# Patient Record
Sex: Female | Born: 1965 | Race: White | Hispanic: No | Smoking: Never smoker
Health system: Southern US, Community
[De-identification: ages and names within clinical notes are randomized; demographics above are authoritative.]

## PROBLEM LIST (undated history)

## (undated) DIAGNOSIS — F329 Major depressive disorder, single episode, unspecified: Secondary | ICD-10-CM

## (undated) DIAGNOSIS — F32A Depression, unspecified: Secondary | ICD-10-CM

## (undated) DIAGNOSIS — F419 Anxiety disorder, unspecified: Secondary | ICD-10-CM

## (undated) DIAGNOSIS — M542 Cervicalgia: Secondary | ICD-10-CM

## (undated) DIAGNOSIS — G4700 Insomnia, unspecified: Secondary | ICD-10-CM

## (undated) HISTORY — DX: Anxiety disorder, unspecified: F41.9

## (undated) HISTORY — DX: Insomnia, unspecified: G47.00

## (undated) HISTORY — DX: Cervicalgia: M54.2

## (undated) HISTORY — DX: Depression, unspecified: F32.A

## (undated) HISTORY — PX: TONSILLECTOMY: SUR1361

## (undated) HISTORY — PX: TUBAL LIGATION: SHX77

## (undated) HISTORY — DX: Major depressive disorder, single episode, unspecified: F32.9

## (undated) HISTORY — PX: OTHER SURGICAL HISTORY: SHX169

---

## 2012-05-04 LAB — HM PAP SMEAR

## 2012-06-21 LAB — HM MAMMOGRAPHY

## 2012-06-30 ENCOUNTER — Ambulatory Visit: Payer: Self-pay

## 2012-12-20 ENCOUNTER — Ambulatory Visit: Payer: Self-pay | Admitting: Family Medicine

## 2013-05-30 ENCOUNTER — Ambulatory Visit: Payer: Self-pay | Admitting: Family Medicine

## 2013-05-31 ENCOUNTER — Ambulatory Visit: Payer: Self-pay | Admitting: Family Medicine

## 2013-07-21 ENCOUNTER — Ambulatory Visit: Payer: Self-pay | Admitting: Family Medicine

## 2014-06-06 ENCOUNTER — Observation Stay: Payer: Self-pay | Admitting: Internal Medicine

## 2014-06-24 ENCOUNTER — Ambulatory Visit
Admit: 2014-06-24 | Disposition: A | Discharge status: Home / Self Care | Payer: Self-pay | Attending: Internal Medicine | Admitting: Internal Medicine

## 2014-06-24 ENCOUNTER — Observation Stay
Admit: 2014-06-24 | Disposition: A | Discharge status: Home / Self Care | Payer: Self-pay | Attending: Surgery | Admitting: Surgery

## 2014-06-24 LAB — COMPREHENSIVE METABOLIC PANEL
ALBUMIN: 4.4 g/dL
ANION GAP: 8 (ref 7–16)
Alkaline Phosphatase: 44 U/L
BUN: 9 mg/dL
Bilirubin,Total: 0.8 mg/dL
Calcium, Total: 9.1 mg/dL
Chloride: 103 mmol/L
Co2: 24 mmol/L
Creatinine: 0.63 mg/dL
EGFR (African American): >60
EGFR (Non-African Amer.): >60
Glucose: 106 mg/dL — ABNORMAL HIGH
POTASSIUM: 3.5 mmol/L
SGOT(AST): 17 U/L
SGPT (ALT): 17 U/L
SODIUM: 135 mmol/L
Total Protein: 7.7 g/dL

## 2014-06-24 LAB — CBC WITH DIFFERENTIAL/PLATELET
BASOS PCT: 0.5 %
Basophil #: 0.1 10*3/uL (ref 0.0–0.1)
EOS ABS: 0.6 10*3/uL (ref 0.0–0.7)
Eosinophil %: 2.8 %
HCT: 41 % (ref 35.0–47.0)
HGB: 13.4 g/dL (ref 12.0–16.0)
LYMPHS PCT: 9.6 %
Lymphocyte #: 2 10*3/uL (ref 1.0–3.6)
MCH: 28.7 pg (ref 26.0–34.0)
MCHC: 32.8 g/dL (ref 32.0–36.0)
MCV: 87 fL (ref 80–100)
MONO ABS: 1.7 x10 3/mm — AB (ref 0.2–0.9)
Monocyte %: 8.2 %
Neutrophil #: 16.1 10*3/uL — ABNORMAL HIGH (ref 1.4–6.5)
Neutrophil %: 78.9 %
PLATELETS: 301 10*3/uL (ref 150–440)
RBC: 4.69 10*6/uL (ref 3.80–5.20)
RDW: 13.7 % (ref 11.5–14.5)
WBC: 20.4 10*3/uL — AB (ref 3.6–11.0)

## 2014-06-25 LAB — CBC WITH DIFFERENTIAL/PLATELET
Basophil #: 0.1 10*3/uL (ref 0.0–0.1)
Basophil %: 0.4 %
EOS PCT: 3.9 %
Eosinophil #: 0.5 10*3/uL (ref 0.0–0.7)
HCT: 35.9 % (ref 35.0–47.0)
HGB: 11.7 g/dL — ABNORMAL LOW (ref 12.0–16.0)
Lymphocyte #: 2 10*3/uL (ref 1.0–3.6)
Lymphocyte %: 14.6 %
MCH: 28.8 pg (ref 26.0–34.0)
MCHC: 32.5 g/dL (ref 32.0–36.0)
MCV: 89 fL (ref 80–100)
MONO ABS: 1.2 x10 3/mm — AB (ref 0.2–0.9)
MONOS PCT: 9 %
NEUTROS ABS: 10 10*3/uL — AB (ref 1.4–6.5)
Neutrophil %: 72.1 %
Platelet: 260 10*3/uL (ref 150–440)
RBC: 4.06 10*6/uL (ref 3.80–5.20)
RDW: 13.7 % (ref 11.5–14.5)
WBC: 13.9 10*3/uL — ABNORMAL HIGH (ref 3.6–11.0)

## 2014-07-17 LAB — SURGICAL PATHOLOGY

## 2014-07-23 NOTE — Discharge Summary (Signed)
PATIENT NAME:  Teresa Gardner, Teresa Gardner MR#:  [id number] DATE OF BIRTH:  09/25/1965  DATE OF ADMISSION:  06/06/2014 DATE OF DISCHARGE:  06/10/2014  PRESENTING COMPLAINT: Abdominal pain with bright red blood per rectum.   DISCHARGE DIAGNOSIS:  Descending colitis.   PROCEDURES: Flexible sigmoidoscopy shows diffuse mild inflammation in the descending colon secondary to colitis, biopsied. Erythematous mucosa in the rectum.   CODE STATUS: Full code.   MEDICATIONS:  1.  Flagyl 500 p.o. t.i.d.  2.  Cipro 500 b.i.d.  3.  Ambien 10 mg 1/2 tablet at bedtime.  4.  Zofran ODT 4 mg 1 tablet every 3 times a day as needed.  5.  Percocet 5/225 one tablet 1-3 times as needed.   DIET: Mechanical soft.   FOLLOWUP:   1.  With Dr. Bluford Kaufmannh in 1 to 2 weeks.  2.  Follow up with your primary care physician Dr. Dossie Arbourrissman in 1-2 weeks.  CONSULTATIONS:  GI with Dr. Bluford Kaufmannh.  BRIEF SUMMARY OF HOSPITAL COURSE: Teresa GraffJanet Kruemmel is a 49 year old Caucasian female with no significant past medical history who came in with abdominal pain and bloody diarrhea. She was admitted with:  1.  Colitis descending colon per CT scan.  She was admitted with abdominal, bloody diarrhea suspected due to colitis in her descending colon. The patient underwent flexible sigmoidoscopy.  Results as above were noted. She was followed by Dr. Bluford Kaufmannh and will complete a course of Cipro and Flagyl as an outpatient for about 10 days and follow up biopsy results with Dr. Bluford Kaufmannoh as an outpatient. She was able to tolerate regular food prior to discharge. 2.  Hypokalemia, improved with supplementation.  3.  Headache, likely stress-related. CT head was negative.   Hospital stay otherwise remained stable.  The patient remained a full code.   TIME SPENT: 40 minutes.   ____________________________ Wylie HailSona A. Allena KatzPatel, MD sap:sp D: 06/11/2014 07:40:00 ET T: 06/11/2014 09:29:55 ET JOB#: [id number]  cc: Alaylah Heatherington A. Allena KatzPatel, MD, <Dictator> Ezzard StandingPaul Y. Bluford Kaufmannh, MD Steele SizerMark A. Crissman, MD Willow OraSONA A Gerard Bonus  MD ELECTRONICALLY SIGNED 06/26/2014 12:17

## 2014-07-23 NOTE — Consult Note (Signed)
Pt seen and examined. Please see C.London's notes. Overall improved. Mild low abdominal tenderness. CT shows colitis. Likely infectious but ischemic also possible. Agree with Abx. Diarrhea and rectal bleeding has stopped. If continues to improve, hold off on flex sig/colonoscopy. If symptoms worsen, then will plan soon. thanks.  Electronic Signatures: Lutricia Feilh, Delane Stalling (MD)  (Signed on 16-Mar-16 17:13)  Authored  Last Updated: 16-Mar-16 17:13 by Lutricia Feilh, Lilyian Quayle (MD)

## 2014-07-23 NOTE — Consult Note (Signed)
PATIENT NAME:  Teresa Gardner, Teresa Gardner MR#:  [id number] DATE OF BIRTH:  12-Nov-1965  DATE OF CONSULTATION:  06/07/2014  REFERRING PHYSICIAN:   CONSULTING PHYSICIAN:  Teresa Barre, NP  REASON FOR CONSULTATION: GI consult ordered by Dr. Clent Ridges for evaluation of GI bleed.   HISTORY OF PRESENT ILLNESS:  Appreciate consult for 49 year old Caucasian woman admitted with abdominal pain/bloody diarrhea for evaluation of same. She states she is generally healthy and has no chronic issues other than arthritis in her neck for which sometimes she takes ibuprofen or has massage/heat therapy. Reports feeling well until early Sunday morning. States she had sushi Friday and steak on Saturday, developed lower abdominal pain and diarrhea early Sunday morning at approximately 2:00 a.m., passed several stools which later became bloody. States the most blood she passed was about 2 tbsp at a time, at some point developed a fever, temperature max 102.4. Presented to the ED yesterday and was subsequently admitted, started on Cipro and Flagyl.  Says no further bloody diarrhea, last was yesterday. Passing small amounts of flatus. Says she continues lower abdominal pain that travels to her back and down her legs. States the abdominal pain may be a little better, but not the back pain.  Her menses started yesterday. No fever today. Blood cultures negative so far. No significant leukocytosis. Hemoglobin stable with normocytic cells. No history of luminal evaluation. Had abdominopelvic CT that demonstrates some mild wall thickening to the descending colon, otherwise unremarkable.   PAST MEDICAL HISTORY:  Osteoarthritis.   HOME MEDICATIONS: Ambien 10 mg p.o. at bedtime p.r.n., ibuprofen p.r.n.   SOCIAL HISTORY: Lives with her 3 children. No tobacco or illicits. Endorses 1 alcoholic beverage every other day.   FAMILY HISTORY: Several members with skin cancer. Great aunt with liver cancer. Maternal grandmother with lymphoma. No known  colorectal cancer, colon polyps, or ulcers.   REVIEW OF SYSTEMS:  Ten systems reviewed, significant for fever, fatigue, weakness, chronic back pain. Otherwise unremarkable.   LABORATORY DATA: Most recent laboratories, glucose 105, BUN less than 5, creatinine 0.7, sodium 137, potassium 3.1, GFR greater than 60, calcium 7.7, lipase normal. Liver panel normal. WBC normal, hemoglobin 11.4, hematocrit 34.9, platelet count 161,000, red cells normocytic. PT 12.8, INR 0.9. Blood cultures negative x 2 so far. CT demonstrating some thickened descending colon wall, otherwise not very remarkable.   PHYSICAL EXAMINATION:  VITAL SIGNS: Most recent vital signs, temperature 98.4, pulse 76, respiratory rate 20, blood pressure 98/62, SaO2 97% on room air.  GENERAL: Well-appearing woman in no acute distress.  HEENT: Normocephalic, atraumatic. Conjunctivae pink. Sclerae clear.  NECK: Supple. No JVD.  CHEST: Respirations eupneic. Lungs clear.  CARDIAC: S1, S2, RRR. No MRG. No edema.  ABDOMEN: Flat. Bowel sounds x 4. Nondistended, soft. Tenderness to the bilateral lower abdominal quadrants. No rebound tenderness, rigidity, guarding, or peritoneal signs, hepatosplenomegaly, or masses.  SKIN: Warm, dry, pink. No erythema, lesion or rash.  EXTREMITIES: MAEW x 4. Strength 5 out of 5. Gait steady.  NEUROLOGICAL: Alert, oriented x 3. Cranial nerves II through XII intact. Speech clear. No facial droop.  PSYCHIATRIC: Pleasant, calm, cooperative.   IMPRESSION AND PLAN: Lower abdominal pain, history of bloody diarrhea, suspect infectious agent, however differential diagnosis includes ischemia and inflammatory bowel disease. Agree with Cipro and Flagyl. Do not think stool studies would be informative at this point as the patient is on antibiotics, no further diarrhea. With her current complaints of pain will get 2 view abdomen today. Likely may need a flexible  sigmoidoscopy depending on her clinical course. Thank you very much  for this consult.   These services were provided by Teresa Pathristiane Marieclaire Bettenhausen, Teresa Gardner, Teresa Gardner, in collaboration with Teresa FeilPaul Oh, MD, with whom I discussed this patient in full.     ____________________________ Teresa Barrehristiane H. Alberto Pina, NP chl:bu D: 06/07/2014 17:11:12 ET T: 06/07/2014 17:36:50 ET JOB#: [id number]  cc: Teresa Barrehristiane H. Marthe Dant, NP, <Dictator> Teresa Gardner ELECTRONICALLY SIGNED 06/08/2014 8:37

## 2014-07-23 NOTE — H&P (Signed)
   Subjective/Chief Complaint 49 yo F with perianal swelling, pain x 3 days   History of Present Illness 49 yo F who presents with 3 days of perianal pain.  Began acutely following episode of colitis.  + low grade fevers.  Presented to urgent care where they attempted to drain it but were unsuccessful.  Also with some back pain and pain in pubis.  No chills, chest pain, shortness of breath, cough, abdominal pain, diarrhea/constipation, nausea/vomiting, dysuria/hematuria.   Past History H/o colitis H/o rhinoplasty H/o tubal ligation   Code Status Full Code   Past Med/Surgical Hx:  GI Bleed:   Rhinoplasty:   Tubal ligation:   ALLERGIES:  No Known Allergies:   Family and Social History:  Family History Cancer   Social History negative tobacco, positive ETOH, Social EtOH   Place of Living Home   Review of Systems:  Subjective/Chief Complaint Perianal pain, swelling   Fever/Chills Yes   Cough No   Sputum No   Abdominal Pain No   Diarrhea No   Constipation No   Nausea/Vomiting No   SOB/DOE No   Tolerating Diet Yes  Nauseated  Vomiting   Physical Exam:  GEN well developed, well nourished, no acute distress   HEENT pink conjunctivae, PERRL, hearing intact to voice, good dentition   RESP normal resp effort  clear BS  no use of accessory muscles   CARD regular rate  no murmur  no thrills   ABD denies tenderness  denies Flank Tenderness  no hernia  soft  normal BS  Anus with perianal pain, swelling, min drainage   EXTR negative cyanosis/clubbing, negative edema   SKIN normal to palpation, No rashes, No ulcers, skin turgor good   NEURO cranial nerves intact, negative rigidity, negative tremor, follows commands, strength:, motor/sensory function intact   PSYCH A+O to time, place, person, good insight    Assessment/Admission Diagnosis 49 yo F with recent colitis, perianal abscess.  H/o recent colitis.  + systemic symptoms which seem to be out of proportion which  what I observe on physical examination.   Plan Will obtain CT to eval for possible intraabdominal process which may be draining through anus.  Likely will admit, NPO, IV abx, IV fluids, probable I and D of perianal abscess in OR.   Electronic Signatures: Jarvis NewcomerLundquist, Taite Baldassari A (MD)  (Signed 02-Apr-16 21:26)  Authored: CHIEF COMPLAINT and HISTORY, PAST MEDICAL/SURGIAL HISTORY, ALLERGIES, FAMILY AND SOCIAL HISTORY, REVIEW OF SYSTEMS, PHYSICAL EXAM, ASSESSMENT AND PLAN   Last Updated: 02-Apr-16 21:26 by Jarvis NewcomerLundquist, Murrel Freet A (MD)

## 2014-07-23 NOTE — Consult Note (Signed)
Brief Consult Note: Diagnosis: abdominal pain, bloody diarrhea.   Patient was seen by consultant.   Consult note dictated.   Comments: Appreciate cx for 48y/o caucasian woman admitted with abdominal pain/bloody diarrhea for evaluation of same. States she is generally healthy and has no chronic issues other than arthritis in her neck, for which sometimes she takes Ibuprofen, or has massage/heat therapy. Reports feeling well until early Sun am. Had sushi Friday and Steak Sat. Developed lower abdominal pain and diarrhea early Sun am, about 2.  Passed several stools, which became bloody. States most blood she passed was about 2tbsp at a time. At some point developed a fever, tmax 102.4. Presented to ED yesterday and was subsequently admitted. Started on Cipro/Flagyl  Says no further bloody diarrhea- last was yesterday. Passing small amts flatus. Says she continues with lower abdominal pain that travels down her legs and lower back pain. States the abd pain may be a little better, but not her back pain. Menses started yesterday. No fever today. Blood cultures negative so far. No significant leulocytosis. Hgb stable with normocytic cells. No history of luminal evaluation. Ct with some mild wall thickening to the descending colon, otherwise unremarkable. Impression and plan: lower abdominal pain, history of bloody diarrhea, suspect infectious, however ddx includes ibd./ischemia. Agree with Cipro and flagyl. Do not think stools studies would be informative at this point as pt on abx and no further diarrhea. With pain will get 2 view abd today. Likely will need at least flex sig for luminal evaluation. Recommend electrolyte correction. Will discuss further with Dr Bluford Kaufmannh..  Electronic Signatures: Vevelyn PatLondon, Christiane H (NP)  (Signed 16-Mar-16 17:19)  Authored: Brief Consult Note   Last Updated: 16-Mar-16 17:19 by Keturah BarreLondon, Christiane H (NP)

## 2014-07-23 NOTE — Consult Note (Signed)
Mild colitis involving descending colon. Moderate colitis involving rectum. Rest of colon looks normal. Multiple biopies taken. Continue Abx. Await bx. Full liquid diet ordered. Contact GI on call if questions. thanks.  Electronic Signatures: Lutricia Feilh, Saifullah Jolley (MD)  (Signed on 18-Mar-16 12:37)  Authored  Last Updated: 18-Mar-16 12:37 by Lutricia Feilh, Laurice Iglesia (MD)

## 2014-07-23 NOTE — Op Note (Signed)
PATIENT NAME:  Teresa Gardner, Teresa Gardner MR#:  [id number] DATE OF BIRTH:  11-28-1965  DATE OF PROCEDURE:  06/25/2014  OPERATION PERFORMED:  1. Anorectal examination under anesthesia.  2. Drainage of perianal abscess.   PREOPERATIVE DIAGNOSIS: Perianal abscess.   POSTOPERATIVE DIAGNOSIS: Perianal abscess.  SURGEON: Claude MangesWilliam F. Samona Chihuahua, MD.   ANESTHESIA: General.   OPERATION IN DETAIL: The patient was placed in the lithotomy position and prepped and draped in the usual fashion. Digital rectal examination failed to reveal any induration within the anal canal or any obvious pathology. The abscess had already spontaneously drained and there was some exudative material within it. It was slightly to the right of the posterior midline such that, with the patient in the lithotomy position, it was at approximately 7 o'clock. The external opening was just a centimeter or two from the external anal canal, and it was 2 cm long and just under a centimeter wide. I debrided the fibrinous exudative material, and there was some granulation tissue at the base. I then used an anoscope, and neither in the posterior midline nor in the right posterior area was there any obvious visual pathology and with a lacrimal duct probe bent such that it hooked toward the outside could not demonstrate any kind of internal fistulous opening. I then forcibly irrigated the external opening with hydrogen peroxide and there was no bubbling within the anal canal. Therefore, I packed the abscess cavity with quarter-inch plain gauze and placed mesh panties on top of it to keep it in place. That concluded the procedure. There were no complications.   ____________________________ Claude MangesWilliam F. Victormanuel Mclure, MD wfm:jh D: 06/25/2014 15:15:20 ET T: 06/26/2014 08:10:42 ET JOB#: [id number]  cc: Claude MangesWilliam F. Lakevia Perris, MD, <Dictator> Claude MangesWILLIAM F Delanee Xin MD ELECTRONICALLY SIGNED 07/03/2014 21:44

## 2014-07-23 NOTE — Consult Note (Signed)
Pt with bouts of vomiting since last night. No bowel movements. No rectal bleeding. Overall abdominal pain has improved. Will plan flex sigmoidoscopy tomorrow AM. Will give tap water enemas tonight until clear. NPO after MN. No bowel prep orally due to vomiting. thanks   Electronic Signatures: Lutricia Feilh, Ashlin Kreps (MD) (Signed on 17-Mar-16 16:14)  Authored   Last Updated: 18-Mar-16 07:54 by Lutricia Feilh, Andrue Dini (MD)

## 2014-07-23 NOTE — H&P (Signed)
PATIENT NAME:  Teresa Gardner, Teresa Gardner MR#:  [id number] DATE OF BIRTH:  October 24, 1965  DATE OF ADMISSION:  06/06/2014  PRIMARY CARE PHYSICIAN: [facility]   REFERRING EMERGENCY ROOM PHYSICIAN: Darien Ramusavid W. Kaminski, MD   CHIEF COMPLAINT: Abdominal pain with bright red blood per rectum.   HISTORY OF PRESENT ILLNESS: This very pleasant 49 year old woman with no significant past medical history presents today after 3 days of bloody bowel movements with progressively worsening abdominal pain. She reports that she awoke on Sunday morning at 2:00 a.m. with abdominal cramping and diarrhea. She went to the bathroom many times and at about 10:00 a.m. she noted the toilet was full of bright red blood. Throughout the day on Sunday, every time she had a bowel movement, it consisted mainly of blood. At one point, she felt urgency and almost lost continence, but other than that has had good control of bowel movements. In the morning on Monday she felt better and actually went to work. During her work day she had a few bowel movements with clotted blood. She went to urgent care, where her rectal examination was positive for frank blood. She was referred to a gastroenterologist at that time. Last night at home she had progressive increase in the pain in her abdomen, back and a headache. She also developed a fever this morning up to 102.4 per the patient report. She then presented to the Emergency Room for further evaluation.   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   HOME MEDICATIONS: Ibuprofen several times a week, not daily.  SOCIAL HISTORY:  She lives with her 3 children.  She does not smoke cigarettes.  She drinks alcohol about 1 alcoholic beverage every other day with more on weekends, up to 4-5 drinks over the weekend. She works for the [employer].   FAMILY MEDICAL HISTORY: Positive for skin cancer. Negative for coronary artery disease, stroke, or diabetes. She does report 1 aunt with  pancreatic and hepatic cancer.   REVIEW OF SYSTEMS: CONSTITUTIONAL: Positive for fever, fatigue, and weakness with abdominal pain. No change in weight.  HEENT: No change in hearing or vision. No pain in the eyes or ears. No sinus congestion, difficulty swallowing, or sore throat.  RESPIRATORY: No shortness of breath, wheezing, hemoptysis, or cough.  CARDIOVASCULAR: No chest pain, edema, arrhythmia, or syncope.  GASTROINTESTINAL: Positive as above for nausea. No vomiting. Diarrhea, abdominal pain, and hematochezia. No hematemesis.  GENITOURINARY: No dysuria or frequency.  ENDOCRINE: No polyuria, polydipsia, or hot or cold intolerance.  SKIN: No new rashes or lesions.  MUSCULOSKELETAL: She has chronic neck pain, but recently has had increasing pain in the back. No pain in his shoulders, knees, or hips. No gout.  NEUROLOGIC: No focal numbness or weakness. No confusion, seizure. Positive for headache.  PSYCHIATRIC: No bipolar disorder or schizophrenia.   PHYSICAL EXAMINATION: VITAL SIGNS: Temperature 98.2, pulse 96, respirations 18, blood pressure 101/68,  oxygenation 98% on room air.  GENERAL: No acute distress.  HEENT: Pupils equal, round, and reactive to light. Extraocular motion intact. Conjunctivae are clear. Oral mucous membranes are dry. Good dentition. Posterior oropharynx is clear with no exudate, edema, or erythema. Trachea is midline.  NECK: Supple. No cervical lymphadenopathy. Thyroid nontender.  RESPIRATORY: Lungs are clear to auscultation bilaterally with good air movement.  CARDIOVASCULAR: Regular rate and rhythm. No murmurs, rubs, or gallops. Peripheral pulses are 2+. No pitting edema.  ABDOMEN: Soft. She has just received a dose of Dilaudid so she is able to  indicate that her pain used to be in the right lower quadrant, but she does not have any pain at this time. No guarding, no rebound, no hepatosplenomegaly, or mass.  SKIN: No rashes, lesions, or open wounds.  MUSCULOSKELETAL:  No joint effusions. Range of motion normal. Strength 5/5 throughout.  NEUROLOGIC: Cranial nerves II-XII grossly intact. Strength and sensation intact. Nonfocal.  PSYCHIATRIC: The patient is alert and oriented, slightly anxious.   LABORATORY DATA: Sodium 135, potassium 3.4, chloride 101, bicarbonate 27, BUN 5, creatinine 0.67, glucose 105, calcium 8.4. LFTs are normal. White blood cells 9.1, hemoglobin 13.3, platelets 185,000,  MCV 89, INR 0.9. UA is negative for signs of infection.  IMAGING:  CT scan of the abdomen and pelvis with contrast shows possible minimal wall thickening along the descending colon though this more than likely remains within normal limits. Colon is otherwise unremarkable in appearance.    ASSESSMENT AND PLAN: Problem #1.  Acute hemorrhagic colitis: She has developed abdominal pain, lower gastrointestinal bleeding, and fever. We will obtain blood cultures and stool cultures. Will start Cipro and Flagyl. She does report eating sushi 2 days prior to symptoms starting. I have ordered a gastroenterology  consultation for further evaluation. She is on a clear liquid diet.  Problem #2.   Headache: This is likely related to her hemorrhagic colitis. We will provide Tylenol for fever and morphine for pain control.  Problem #3.  Prophylaxis: No pharmacological deep venous thrombosis prophylaxis in the setting of acute gastrointestinal bleed.   TIME SPENT ON ADMISSION: 40 minutes.     ____________________________ Ena Dawley. Clent Ridges, MD cpw:LT D: 06/06/2014 20:44:32 ET T: 06/06/2014 21:24:01 ET JOB#: [id number]  cc: Ena Dawley. Clent Ridges, MD, <Dictator> Gale Journey MD ELECTRONICALLY SIGNED 06/13/2014 10:21

## 2014-11-01 ENCOUNTER — Encounter: Payer: Self-pay | Admitting: Unknown Physician Specialty

## 2014-11-01 ENCOUNTER — Ambulatory Visit (INDEPENDENT_AMBULATORY_CARE_PROVIDER_SITE_OTHER): Payer: BLUE CROSS/BLUE SHIELD | Admitting: Unknown Physician Specialty

## 2014-11-01 VITALS — BP 121/72 | HR 78 | Temp 98.1°F | Ht 63.5 in | Wt 148.0 lb

## 2014-11-01 DIAGNOSIS — Z0000 Encounter for general adult medical examination without abnormal findings: Secondary | ICD-10-CM | POA: Diagnosis not present

## 2014-11-01 DIAGNOSIS — G4700 Insomnia, unspecified: Secondary | ICD-10-CM | POA: Diagnosis not present

## 2014-11-01 DIAGNOSIS — F329 Major depressive disorder, single episode, unspecified: Secondary | ICD-10-CM | POA: Insufficient documentation

## 2014-11-01 DIAGNOSIS — R3129 Other microscopic hematuria: Secondary | ICD-10-CM | POA: Insufficient documentation

## 2014-11-01 DIAGNOSIS — M542 Cervicalgia: Secondary | ICD-10-CM

## 2014-11-01 DIAGNOSIS — F419 Anxiety disorder, unspecified: Principal | ICD-10-CM

## 2014-11-01 NOTE — Progress Notes (Signed)
BP 121/72 mmHg  Pulse 78  Temp(Src) 98.1 F (36.7 C)  Ht 5' 3.5" (1.613 m)  Wt 148 lb (67.132 kg)  BMI 25.80 kg/m2  SpO2 99%  LMP 10/11/2014 (Exact Date)   Subjective:    Patient ID: Teresa Gardner, female    DOB: 01-Dec-1965, 49 y.o.   MRN: [id number]  HPI: Teresa Gardner is a 49 y.o. female  Chief Complaint  Patient presents with  . Annual Exam    Relevant past medical, surgical, family and social history reviewed and updated as indicated. Interim medical history since our last visit reviewed. Allergies and medications reviewed and updated.  Reviewed past notes in which she suffered with colitis and a peri-rectal abcess.  She is fully recovered.  She has a new job and her headaches are gone.   States she is off her anti-depressants, but she does have PMDD.  She suspects this is hormonal   Review of Systems  Constitutional: Positive for fatigue.  HENT: Negative.   Eyes: Negative.   Respiratory: Negative.   Cardiovascular: Negative.   Gastrointestinal: Negative.   Endocrine: Negative.   Genitourinary: Negative.   Musculoskeletal: Negative.   Skin: Negative.   Allergic/Immunologic: Negative.   Neurological: Negative.   Hematological: Negative.   Psychiatric/Behavioral: Negative.        Anxiety and depression are better.  She does have occasional bouts of insomnia    Per HPI unless specifically indicated above     Objective:    BP 121/72 mmHg  Pulse 78  Temp(Src) 98.1 F (36.7 C)  Ht 5' 3.5" (1.613 m)  Wt 148 lb (67.132 kg)  BMI 25.80 kg/m2  SpO2 99%  LMP 10/11/2014 (Exact Date)  Wt Readings from Last 3 Encounters:  11/01/14 148 lb (67.132 kg)  05/01/14 143 lb (64.864 kg)    Physical Exam  Constitutional: She is oriented to person, place, and time. She appears well-developed and well-nourished.  HENT:  Head: Normocephalic and atraumatic.  Eyes: Pupils are equal, round, and reactive to light. Right eye exhibits no discharge. Left eye exhibits no  discharge. No scleral icterus.  Neck: Normal range of motion. Neck supple. Carotid bruit is not present. No thyromegaly present.  Cardiovascular: Normal rate, regular rhythm and normal heart sounds.  Exam reveals no gallop and no friction rub.   No murmur heard. Pulmonary/Chest: Effort normal and breath sounds normal. No respiratory distress. She has no wheezes. She has no rales.  Abdominal: Soft. Bowel sounds are normal. There is no tenderness. There is no rebound.  Genitourinary: No breast swelling, tenderness or discharge.  Musculoskeletal: Normal range of motion.  Lymphadenopathy:    She has no cervical adenopathy.  Neurological: She is alert and oriented to person, place, and time.  Skin: Skin is warm, dry and intact. No rash noted.  Psychiatric: She has a normal mood and affect. Her speech is normal and behavior is normal. Judgment and thought content normal. Cognition and memory are normal.  Nursing note and vitals reviewed.     Assessment & Plan:   Problem List Items Addressed This Visit      Unprioritized   Insomnia - Primary    She has bouts of insomnia.  She would like to have a prescription for Ambien on occasion.  OK for #30 to break in half.  One prescription should last 6 months.         Relevant Orders   Vit D  25 hydroxy (rtn osteoporosis monitoring)    Other  Visit Diagnoses    Annual physical exam        Relevant Orders    CBC with Differential/Platelet    Comprehensive metabolic panel    HIV antibody    TSH    Vit D  25 hydroxy (rtn osteoporosis monitoring)    Lipid Panel w/o Chol/HDL Ratio    MM DIGITAL SCREENING BILATERAL        Follow up plan: Return in about 1 year (around 11/01/2015).

## 2014-11-01 NOTE — Patient Instructions (Addendum)
Supplements: Vitamin D 1-2 K/day  I recommend CBT for sleep: You can visit [facility]BT[facility]leep[facility] or shuti.com Insomnia Insomnia is frequent trouble falling and/or staying asleep. Insomnia can be a long term problem or a short term problem. Both are common. Insomnia can be a short term problem when the wakefulness is related to a certain stress or worry. Long term insomnia is often related to ongoing stress during waking hours and/or poor sleeping habits. Overtime, sleep deprivation itself can make the problem worse. Every little thing feels more severe because you are overtired and your ability to cope is decreased. CAUSES   Stress, anxiety, and depression.  Poor sleeping habits.  Distractions such as TV in the bedroom.  Naps close to bedtime.  Engaging in emotionally charged conversations before bed.  Technical reading before sleep.  Alcohol and other sedatives. They may make the problem worse. They can hurt normal sleep patterns and normal dream activity.  Stimulants such as caffeine for several hours prior to bedtime.  Pain syndromes and shortness of breath can cause insomnia.  Exercise late at night.  Changing time zones may cause sleeping problems (jet lag). It is sometimes helpful to have someone observe your sleeping patterns. They should look for periods of not breathing during the night (sleep apnea). They should also look to see how long those periods last. If you live alone or observers are uncertain, you can also be observed at a sleep clinic where your sleep patterns will be professionally monitored. Sleep apnea requires a checkup and treatment. Give your caregivers your medical history. Give your caregivers observations your family has made about your sleep.  SYMPTOMS   Not feeling rested in the morning.  Anxiety and restlessness at bedtime.  Difficulty falling and staying asleep. TREATMENT   Your caregiver may prescribe treatment for an underlying medical disorders.  Your caregiver can give advice or help if you are using alcohol or other drugs for self-medication. Treatment of underlying problems will usually eliminate insomnia problems.  Medications can be prescribed for short time use. They are generally not recommended for lengthy use.  Over-the-counter sleep medicines are not recommended for lengthy use. They can be habit forming.  You can promote easier sleeping by making lifestyle changes such as:  Using relaxation techniques that help with breathing and reduce muscle tension.  Exercising earlier in the day.  Changing your diet and the time of your last meal. No night time snacks.  Establish a regular time to go to bed.  Counseling can help with stressful problems and worry.  Soothing music and white noise may be helpful if there are background noises you cannot remove.  Stop tedious detailed work at least one hour before bedtime. HOME CARE INSTRUCTIONS   Keep a diary. Inform your caregiver about your progress. This includes any medication side effects. See your caregiver regularly. Take note of:  Times when you are asleep.  Times when you are awake during the night.  The quality of your sleep.  How you feel the next day. This information will help your caregiver care for you.  Get out of bed if you are still awake after 15 minutes. Read or do some quiet activity. Keep the lights down. Wait until you feel sleepy and go back to bed.  Keep regular sleeping and waking hours. Avoid naps.  Exercise regularly.  Avoid distractions at bedtime. Distractions include watching television or engaging in any intense or detailed activity like attempting to balance the household checkbook.  Develop a  bedtime ritual. Keep a familiar routine of bathing, brushing your teeth, climbing into bed at the same time each night, listening to soothing music. Routines increase the success of falling to sleep faster.  Use relaxation techniques. This can be  using breathing and muscle tension release routines. It can also include visualizing peaceful scenes. You can also help control troubling or intruding thoughts by keeping your mind occupied with boring or repetitive thoughts like the old concept of counting sheep. You can make it more creative like imagining planting one beautiful flower after another in your backyard garden.  During your day, work to eliminate stress. When this is not possible use some of the previous suggestions to help reduce the anxiety that accompanies stressful situations. MAKE SURE YOU:   Understand these instructions.  Will watch your condition.  Will get help right away if you are not doing well or get worse. Document Released: 03/07/2000 Document Revised: 06/02/2011 Document Reviewed: 04/07/2007 [facility] Patient Information 2015 [place], [place]. This information is not intended to replace advice given to you by your health care provider. Make sure you discuss any questions you have with your health care provider.

## 2014-11-01 NOTE — Assessment & Plan Note (Addendum)
She has bouts of insomnia.  She would like to have a prescription for Ambien on occasion.  OK for #30 to break in half.  One prescription should last 6 months.

## 2014-11-02 ENCOUNTER — Telehealth: Payer: Self-pay

## 2014-11-02 ENCOUNTER — Encounter: Payer: Self-pay | Admitting: Unknown Physician Specialty

## 2014-11-02 LAB — CBC WITH DIFFERENTIAL/PLATELET
BASOS: 0 %
Basophils Absolute: 0 10*3/uL (ref 0.0–0.2)
EOS (ABSOLUTE): 0.5 10*3/uL — ABNORMAL HIGH (ref 0.0–0.4)
EOS: 5 %
HEMOGLOBIN: 13.4 g/dL (ref 11.1–15.9)
Hematocrit: 41.4 % (ref 34.0–46.6)
IMMATURE GRANS (ABS): 0 10*3/uL (ref 0.0–0.1)
IMMATURE GRANULOCYTES: 0 %
LYMPHS ABS: 3 10*3/uL (ref 0.7–3.1)
Lymphs: 31 %
MCH: 29 pg (ref 26.6–33.0)
MCHC: 32.4 g/dL (ref 31.5–35.7)
MCV: 90 fL (ref 79–97)
Monocytes Absolute: 0.8 10*3/uL (ref 0.1–0.9)
Monocytes: 8 %
Neutrophils Absolute: 5.3 10*3/uL (ref 1.4–7.0)
Neutrophils: 56 %
Platelets: 255 10*3/uL (ref 150–379)
RBC: 4.62 x10E6/uL (ref 3.77–5.28)
RDW: 13.6 % (ref 12.3–15.4)
WBC: 9.7 10*3/uL (ref 3.4–10.8)

## 2014-11-02 LAB — COMPREHENSIVE METABOLIC PANEL
ALBUMIN: 4.4 g/dL (ref 3.5–5.5)
ALT: 14 IU/L (ref 0–32)
AST: 16 IU/L (ref 0–40)
Albumin/Globulin Ratio: 1.8 (ref 1.1–2.5)
Alkaline Phosphatase: 44 IU/L (ref 39–117)
BILIRUBIN TOTAL: 0.4 mg/dL (ref 0.0–1.2)
BUN/Creatinine Ratio: 16 (ref 9–23)
BUN: 13 mg/dL (ref 6–24)
CHLORIDE: 101 mmol/L (ref 97–108)
CO2: 24 mmol/L (ref 18–29)
Calcium: 9.1 mg/dL (ref 8.7–10.2)
Creatinine, Ser: 0.81 mg/dL (ref 0.57–1.00)
GFR calc Af Amer: 99 mL/min/{1.73_m2} (ref >59)
GFR calc non Af Amer: 86 mL/min/{1.73_m2} (ref >59)
GLOBULIN, TOTAL: 2.4 g/dL (ref 1.5–4.5)
GLUCOSE: 90 mg/dL (ref 65–99)
Potassium: 4 mmol/L (ref 3.5–5.2)
Sodium: 140 mmol/L (ref 134–144)
TOTAL PROTEIN: 6.8 g/dL (ref 6.0–8.5)

## 2014-11-02 LAB — VITAMIN D 25 HYDROXY (VIT D DEFICIENCY, FRACTURES): Vit D, 25-Hydroxy: 34.2 ng/mL (ref 30.0–100.0)

## 2014-11-02 LAB — TSH: TSH: 1.36 u[IU]/mL (ref 0.450–4.500)

## 2014-11-02 LAB — LIPID PANEL W/O CHOL/HDL RATIO
Cholesterol, Total: 176 mg/dL (ref 100–199)
HDL: 68 mg/dL (ref >39)
LDL Calculated: 75 mg/dL (ref 0–99)
TRIGLYCERIDES: 165 mg/dL — AB (ref 0–149)
VLDL CHOLESTEROL CAL: 33 mg/dL (ref 5–40)

## 2014-11-02 LAB — HIV ANTIBODY (ROUTINE TESTING W REFLEX): HIV SCREEN 4TH GENERATION: NONREACTIVE

## 2014-11-02 MED ORDER — ZOLPIDEM TARTRATE 10 MG PO TABS: 10.0000 mg | ORAL_TABLET | Freq: Every evening | ORAL | Status: DC | PRN

## 2014-11-02 NOTE — Telephone Encounter (Signed)
Patient called stating that Teresa Gardner was going to start Ambien, which is documented in the note. I cannot see where the prescription was wrote and the medication is not on the medication list in the patient's chart. Patient would like prescription sent to [facility] in [place].

## 2014-11-02 NOTE — Telephone Encounter (Signed)
Written.  It might have to wait until tomorrow when I can sign it.

## 2014-11-03 ENCOUNTER — Telehealth: Payer: Self-pay | Admitting: Unknown Physician Specialty

## 2014-11-03 NOTE — Telephone Encounter (Signed)
Called and left patient a voicemail stating that her rx was ready for pick up at the front desk here at the office.

## 2014-11-03 NOTE — Telephone Encounter (Signed)
Pt called stated her Ambien was not sent to the pharmacy. Pharm is [facility] in [place]. Please send ASAP. Thanks.

## 2015-01-09 ENCOUNTER — Telehealth: Payer: Self-pay

## 2015-01-09 ENCOUNTER — Ambulatory Visit (INDEPENDENT_AMBULATORY_CARE_PROVIDER_SITE_OTHER): Payer: BLUE CROSS/BLUE SHIELD | Admitting: Unknown Physician Specialty

## 2015-01-09 ENCOUNTER — Encounter: Payer: Self-pay | Admitting: Unknown Physician Specialty

## 2015-01-09 VITALS — BP 128/86 | HR 62 | Temp 98.4°F | Ht 64.0 in | Wt 144.2 lb

## 2015-01-09 DIAGNOSIS — R1310 Dysphagia, unspecified: Secondary | ICD-10-CM | POA: Diagnosis not present

## 2015-01-09 DIAGNOSIS — H8110 Benign paroxysmal vertigo, unspecified ear: Secondary | ICD-10-CM | POA: Diagnosis not present

## 2015-01-09 DIAGNOSIS — M62838 Other muscle spasm: Secondary | ICD-10-CM | POA: Diagnosis not present

## 2015-01-09 DIAGNOSIS — F329 Major depressive disorder, single episode, unspecified: Secondary | ICD-10-CM | POA: Insufficient documentation

## 2015-01-09 DIAGNOSIS — M5412 Radiculopathy, cervical region: Secondary | ICD-10-CM | POA: Diagnosis not present

## 2015-01-09 DIAGNOSIS — M542 Cervicalgia: Secondary | ICD-10-CM | POA: Diagnosis not present

## 2015-01-09 DIAGNOSIS — F32A Depression, unspecified: Secondary | ICD-10-CM

## 2015-01-09 MED ORDER — FLUOXETINE HCL 10 MG PO TABS: 10.0000 mg | ORAL_TABLET | Freq: Every day | ORAL | Status: DC

## 2015-01-09 MED ORDER — TRAMADOL HCL 50 MG PO TABS: 50.0000 mg | ORAL_TABLET | Freq: Three times a day (TID) | ORAL | Status: DC | PRN

## 2015-01-09 MED ORDER — CYCLOBENZAPRINE HCL 10 MG PO TABS: 10.0000 mg | ORAL_TABLET | Freq: Three times a day (TID) | ORAL | Status: DC | PRN

## 2015-01-09 NOTE — Telephone Encounter (Signed)
Patient called and asked about her appointment for her mammogram. I saw that the order for the mammogram was placed so I called [facility] and scheduled the mammogram for the patient. Then I called the patient back to let her know her appointment date and time.

## 2015-01-09 NOTE — Progress Notes (Signed)
BP 128/86 mmHg  Pulse 62  Temp(Src) 98.4 F (36.9 C)  Ht  (1.626 m)  Wt 144 lb 3.2 oz (65.409 kg)  BMI 24.74 kg/m2  SpO2 98%  LMP 12/28/2014 (Exact Date)   Subjective:    Patient ID: Teresa Gardner, female    DOB: 19-Oct-1965, 49 y.o.   MRN: [id number]  HPI: Jennfier Gardner is a 49 y.o. female  Chief Complaint  Patient presents with  . Neck Pain    pt states she has had on going neck pain for years but it has gotten worse within the last month (since around 12/10/14). Pt states she feels like she is being choked and states it hurts in her thraot area and in the back of her neck. Patient states when she moves her neck that she sometimes gets dizzy.   Pt is here for issues above.  She is nervous because of vertigo following self trigger point therapy.  She states this doesn't feel like "inner ear" to her which she had "a long time ago.  It seems to be when she is in certain positions or stretching in a certain way.   States she is also having trouble swallowing intermittently and feeling "choked " and a feeling of pressure in her throat all the time.  She does state that she is having pain radiating down right arm to her wrist.  Describes it on the back of her arm.  She does describle a "cold" sensation in pinkies.  She is laying on the heating pad every night.  She had some Tylenol #3 that she had from a surgical procedure.  She uses Ambien nightly "just to be comfortable."  She did have a head CT about 6 months ago while in the hospital for ischemic colitis.  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    Depression screen [facility] 2/9 01/09/2015 11/01/2014  Decreased Interest 0 0  Down, Depressed, Hopeless 1 1  PHQ - 2 Score 1 1  Altered sleeping 1 -  Tired, decreased energy 3 -  Change in appetite 3 -  Feeling bad or failure about yourself  1 -  Trouble concentrating 2 -  Moving slowly or fidgety/restless 0 -  Suicidal thoughts 0 -  PHQ-9 Score 11 -  Difficult doing work/chores  Somewhat difficult -       Reviewed hospital notes and notes in [facility].    She has been to physical therapy multiple times over the years (5-10), chiropractic care, massage therapy x 1 month. And TENS therapy.      Relevant past medical, surgical, family and social history reviewed and updated as indicated. Interim medical history since our last visit reviewed. Allergies and medications reviewed and updated.  Review of Systems  Constitutional: Positive for fatigue.       States she is "tired" all the time.  HENT:       Feels that right eye is puffy "all the time" and "pressing down."  Respiratory: Negative.   Cardiovascular: Negative.   Genitourinary: Negative.   Skin: Negative.   Neurological: Negative.   Psychiatric/Behavioral: Negative.     Per HPI unless specifically indicated above     Objective:    BP 128/86 mmHg  Pulse 62  Temp(Src) 98.4 F (36.9 C)  Ht  (1.626 m)  Wt 144 lb 3.2 oz (65.409 kg)  BMI 24.74 kg/m2  SpO2 98%  LMP 12/28/2014 (Exact Date)  Wt Readings from Last 3 Encounters:  01/09/15  144 lb 3.2 oz (65.409 kg)  11/01/14 148 lb (67.132 kg)  05/01/14 143 lb (64.864 kg)    Physical Exam  Constitutional: She is oriented to person, place, and time. She appears well-developed and well-nourished. No distress.  HENT:  Head: Normocephalic and atraumatic.  Right Ear: Tympanic membrane, external ear and ear canal normal.  Left Ear: Tympanic membrane, external ear and ear canal normal.  Eyes: Conjunctivae and lids are normal. Right eye exhibits no discharge. Left eye exhibits no discharge. No scleral icterus.  Cardiovascular: Normal rate and regular rhythm.   Pulmonary/Chest: Effort normal. No respiratory distress.  Abdominal: Normal appearance and bowel sounds are normal. There is no splenomegaly or hepatomegaly.  Musculoskeletal:       Cervical back: She exhibits decreased range of motion, tenderness, bony tenderness, swelling, pain and  spasm.  Neurological: She is alert and oriented to person, place, and time.  Skin: Skin is intact. No rash noted. No pallor.  Psychiatric: She has a normal mood and affect. Her behavior is normal. Judgment and thought content normal.      Assessment & Plan:   Problem List Items Addressed This Visit      Unprioritized   Cervicalgia - Primary    Flexeril at night.  Order MRI due to radicular symptoms right arm.  Rx for Tramadol      Relevant Orders   MR Cervical Spine Wo Contrast   Depression    Rx for Fluoxetine which helped in the past.        Relevant Medications   FLUoxetine (PROZAC) 10 MG tablet    Other Visit Diagnoses    Cervical radiculopathy        Relevant Medications    zolpidem (AMBIEN) 10 MG tablet    cyclobenzaprine (FLEXERIL) 10 MG tablet    FLUoxetine (PROZAC) 10 MG tablet    Other Relevant Orders    MR Cervical Spine Wo Contrast    Dysphagia        suspect related to muscle spasm    BPV (benign positional vertigo), unspecified laterality        Suspect cause for vertigo    Muscle spasm        flexeril as above.  Avoid NSAIDs due to colitis.  Tramadol for pain        Follow up plan: Return in about 4 weeks (around 02/06/2015).

## 2015-01-09 NOTE — Assessment & Plan Note (Signed)
Rx for Fluoxetine which helped in the past.

## 2015-01-09 NOTE — Assessment & Plan Note (Addendum)
Flexeril at night.  Order MRI due to radicular symptoms right arm.  Rx for Tramadol

## 2015-01-10 ENCOUNTER — Telehealth: Payer: Self-pay

## 2015-01-10 NOTE — Telephone Encounter (Signed)
Patient called and let me a voicemail asking for me to tell her the date of her last neck x-ray. So I looked back in practice partner and let her know that her last neck x-ray was 12/20/12. I asked the patient to give me a call back if I could help her with anything else.

## 2015-01-18 ENCOUNTER — Ambulatory Visit
Admission: RE | Admit: 2015-01-18 | Discharge: 2015-01-18 | Disposition: A | Discharge status: Home / Self Care | Payer: BLUE CROSS/BLUE SHIELD | Source: Ambulatory Visit | Attending: Unknown Physician Specialty | Admitting: Unknown Physician Specialty

## 2015-01-18 ENCOUNTER — Encounter: Payer: Self-pay | Admitting: Unknown Physician Specialty

## 2015-01-18 DIAGNOSIS — M5412 Radiculopathy, cervical region: Secondary | ICD-10-CM

## 2015-01-18 DIAGNOSIS — Z1231 Encounter for screening mammogram for malignant neoplasm of breast: Secondary | ICD-10-CM | POA: Diagnosis present

## 2015-01-18 DIAGNOSIS — M50222 Other cervical disc displacement at C5-C6 level: Secondary | ICD-10-CM | POA: Insufficient documentation

## 2015-01-18 DIAGNOSIS — M4802 Spinal stenosis, cervical region: Secondary | ICD-10-CM | POA: Insufficient documentation

## 2015-01-18 DIAGNOSIS — M47892 Other spondylosis, cervical region: Secondary | ICD-10-CM | POA: Insufficient documentation

## 2015-01-18 DIAGNOSIS — M542 Cervicalgia: Secondary | ICD-10-CM | POA: Diagnosis present

## 2015-01-18 DIAGNOSIS — Z0000 Encounter for general adult medical examination without abnormal findings: Secondary | ICD-10-CM

## 2015-01-19 ENCOUNTER — Encounter: Payer: Self-pay | Admitting: Unknown Physician Specialty

## 2015-01-19 NOTE — Telephone Encounter (Signed)
Forward to provider

## 2015-01-19 NOTE — Telephone Encounter (Signed)
Can you get her referral sooner than 3 months Teresa Gardner?

## 2015-01-24 ENCOUNTER — Encounter: Payer: Self-pay | Admitting: Unknown Physician Specialty

## 2015-01-29 ENCOUNTER — Other Ambulatory Visit: Payer: Self-pay | Admitting: Unknown Physician Specialty

## 2015-01-30 ENCOUNTER — Other Ambulatory Visit: Payer: Self-pay | Admitting: Unknown Physician Specialty

## 2015-02-09 ENCOUNTER — Other Ambulatory Visit: Payer: Self-pay | Admitting: Neurosurgery

## 2015-02-09 ENCOUNTER — Ambulatory Visit: Payer: BLUE CROSS/BLUE SHIELD | Admitting: Unknown Physician Specialty

## 2015-02-12 ENCOUNTER — Other Ambulatory Visit: Payer: Self-pay | Admitting: Unknown Physician Specialty

## 2015-02-12 ENCOUNTER — Other Ambulatory Visit (HOSPITAL_COMMUNITY): Payer: Self-pay | Admitting: Neurosurgery

## 2015-02-12 DIAGNOSIS — M5023 Other cervical disc displacement, cervicothoracic region: Secondary | ICD-10-CM

## 2015-02-22 ENCOUNTER — Encounter: Payer: Self-pay | Admitting: Family Medicine

## 2015-02-22 ENCOUNTER — Ambulatory Visit (INDEPENDENT_AMBULATORY_CARE_PROVIDER_SITE_OTHER): Payer: BLUE CROSS/BLUE SHIELD | Admitting: Family Medicine

## 2015-02-22 VITALS — BP 109/75 | HR 97 | Temp 97.5°F | Resp 16 | Ht 64.0 in | Wt 144.0 lb

## 2015-02-22 DIAGNOSIS — T7840XA Allergy, unspecified, initial encounter: Secondary | ICD-10-CM

## 2015-02-22 DIAGNOSIS — L509 Urticaria, unspecified: Secondary | ICD-10-CM

## 2015-02-22 HISTORY — PX: NECK SURGERY: SHX720

## 2015-02-22 MED ORDER — TRIAMCINOLONE ACETONIDE 40 MG/ML IJ SUSP
40.0000 mg | Freq: Once | INTRAMUSCULAR | Status: AC
Start: 2015-02-22 — End: 2015-02-22
  Administered 2015-02-22: 40 mg via INTRAMUSCULAR

## 2015-02-22 MED ORDER — EPINEPHRINE 0.3 MG/0.3ML IJ SOAJ: 0.3000 mg | Freq: Once | INTRAMUSCULAR | Status: AC

## 2015-02-22 MED ORDER — LORATADINE 10 MG PO TABS: 10.0000 mg | ORAL_TABLET | Freq: Every day | ORAL | Status: DC

## 2015-02-22 MED ORDER — PREDNISONE 10 MG PO TABS: 10.0000 mg | ORAL_TABLET | Freq: Every day | ORAL | Status: DC

## 2015-02-22 NOTE — Progress Notes (Signed)
Administered Kenalog 40mg  Right Glute

## 2015-02-22 NOTE — Patient Instructions (Signed)
GO to the ER if you have trouble breathing, chest pain, or can't see.   Take claritin in the morning and benadryl at night.   I want you to follow-up with Elnita Maxwellheryl next week.

## 2015-02-22 NOTE — Progress Notes (Signed)
Subjective:    Patient ID: Teresa Gardner, female    DOB: [birth year], 49 y.o.   MRN: [id number]  HPI: Teresa Gardner is a 49 y.o. female presenting on 02/22/2015 for Rash   Rash This is a new problem. The current episode started yesterday. The problem has been rapidly worsening since onset. The rash is diffuse. The rash is characterized by itchiness and swelling. Pertinent negatives include no fever, shortness of breath or vomiting.  Allergic Reaction This is a new problem. The current episode started yesterday. The problem has been gradually worsening since onset. The patient was exposed to an antibiotic. Associated symptoms include a rash. Pertinent negatives include no chest pain, chest pressure, difficulty breathing, trouble swallowing, vomiting or wheezing. Swelling is present on the eyes. Past treatments include diphenhydramine. The treatment provided mild relief. Her past medical history is significant for food allergies (fish.).    Pt is a [employer] patient being seen for allergic reaction.  Rash started yesterday on inner thighs has moved all over body including hands, neck, face. Pt has taken several doses of benadryl.  Pt had MRI yesterday- got iodine injected for procedure- however rash was already present.  No new soap, no detergents.  Was previously on Cipro for UTI finished Tuesday night.  Took daughters Xanax yesterday- but she has taken before no problem.  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 She felt as though her throat was closing yesterday but she thought that was a panic attack. No trouble breathing today.   Past Medical History  Diagnosis Date  . Anxiety   . Depression   . Insomnia   . Cervicalgia    Social History   Social History  . Marital Status: Divorced    Spouse Name: N/A  . Number of Children: N/A  . Years of Education: N/A   Occupational History  . Not on file.   Social History Main Topics  . Smoking status:  Never Smoker   . Smokeless tobacco: Never Used  . Alcohol Use: 0.0 oz/week    0 Standard drinks or equivalent per week     Comment: on occasion  . Drug Use: No  . Sexual Activity: Yes   Other Topics Concern  . Not on file   Social History Narrative   Family History  Problem Relation Age of Onset  . Mental illness Daughter     bipolar  . Autism Son   . Leukemia Maternal Grandmother   . Alzheimer's disease Paternal Grandmother   . Liver disease Paternal Grandmother   . Breast cancer Neg Hx    Current Outpatient Prescriptions on File Prior to Visit  Medication Sig  . FLUoxetine (PROZAC) 10 MG tablet Take 1 tablet (10 mg total) by mouth daily.  Marland Kitchen zolpidem (AMBIEN) 10 MG tablet TAKE 1 TABLET BY MOUTH AT BEDTIME AS NEEDED FOR SLEEP   No current facility-administered medications on file prior to visit.    Review of Systems  Constitutional: Negative for fever and chills.  HENT: Negative for trouble swallowing.   Respiratory: Negative for chest tightness, shortness of breath and wheezing.   Cardiovascular: Negative for chest pain, palpitations and leg swelling.  Gastrointestinal: Negative for nausea and vomiting.  Skin: Positive for rash.  Allergic/Immunologic: Positive for food allergies (fish.).   Per HPI unless specifically indicated above     Objective:    BP 109/75 mmHg  Pulse 97  Temp(Src) 97.5 F (36.4 C)  Resp 16  Ht  5\' 4"  (1.626 m)  Wt 144 lb (65.318 kg)  BMI 24.71 kg/m2  Wt Readings from Last 3 Encounters:  02/22/15 144 lb (65.318 kg)  01/09/15 144 lb 3.2 oz (65.409 kg)  11/01/14 148 lb (67.132 kg)    Physical Exam  Skin: Skin is warm and dry. Rash noted. Rash is papular and urticarial. She is not diaphoretic. No pallor.  Diffuse wheals throughout face, hands, neck, chest, abdomen, and legs.    Results for orders placed or performed in visit on 11/01/14  CBC with Differential/Platelet  Result Value Ref Range   WBC 9.7 3.4 - 10.8 x10E3/uL   RBC 4.62  3.77 - 5.28 x10E6/uL   Hemoglobin 13.4 11.1 - 15.9 g/dL   Hematocrit 16.141.4 09.634.0 - 46.6 %   MCV 90 79 - 97 fL   MCH 29.0 26.6 - 33.0 pg   MCHC 32.4 31.5 - 35.7 g/dL   RDW 04.513.6 40.912.3 - 81.115.4 %   Platelets 255 150 - 379 x10E3/uL   Neutrophils 56 %   Lymphs 31 %   Monocytes 8 %   Eos 5 %   Basos 0 %   Neutrophils Absolute 5.3 1.4 - 7.0 x10E3/uL   Lymphocytes Absolute 3.0 0.7 - 3.1 x10E3/uL   Monocytes Absolute 0.8 0.1 - 0.9 x10E3/uL   EOS (ABSOLUTE) 0.5 (H) 0.0 - 0.4 x10E3/uL   Basophils Absolute 0.0 0.0 - 0.2 x10E3/uL   Immature Granulocytes 0 %   Immature Grans (Abs) 0.0 0.0 - 0.1 x10E3/uL  Comprehensive metabolic panel  Result Value Ref Range   Glucose 90 65 - 99 mg/dL   BUN 13 6 - 24 mg/dL   Creatinine, Ser 9.140.81 0.57 - 1.00 mg/dL   GFR calc non Af Amer 86 >59 mL/min/1.73   GFR calc Af Amer 99 >59 mL/min/1.73   BUN/Creatinine Ratio 16 9 - 23   Sodium 140 134 - 144 mmol/L   Potassium 4.0 3.5 - 5.2 mmol/L   Chloride 101 97 - 108 mmol/L   CO2 24 18 - 29 mmol/L   Calcium 9.1 8.7 - 10.2 mg/dL   Total Protein 6.8 6.0 - 8.5 g/dL   Albumin 4.4 3.5 - 5.5 g/dL   Globulin, Total 2.4 1.5 - 4.5 g/dL   Albumin/Globulin Ratio 1.8 1.1 - 2.5   Bilirubin Total 0.4 0.0 - 1.2 mg/dL   Alkaline Phosphatase 44 39 - 117 IU/L   AST 16 0 - 40 IU/L   ALT 14 0 - 32 IU/L  HIV antibody  Result Value Ref Range   HIV Screen 4th Generation wRfx Non Reactive Non Reactive  TSH  Result Value Ref Range   TSH 1.360 0.450 - 4.500 uIU/mL  Vit D  25 hydroxy (rtn osteoporosis monitoring)  Result Value Ref Range   Vit D, 25-Hydroxy 34.2 30.0 - 100.0 ng/mL  Lipid Panel w/o Chol/HDL Ratio  Result Value Ref Range   Cholesterol, Total 176 100 - 199 mg/dL   Triglycerides 782165 (H) 0 - 149 mg/dL   HDL 68 >95>39 mg/dL   VLDL Cholesterol Cal 33 5 - 40 mg/dL   LDL Calculated 75 0 - 99 mg/dL      Assessment & Plan:   Problem List Items Addressed This Visit    None    Visit Diagnoses    Allergic reaction, initial  encounter    -  Primary    Suspect cipro as allergen. Possible from MRA injected dye. Pt will make neurosurgen aware. IM kenalog and prednisone  taper. Anti-histamines. Alarm symptoms reviewed.  Epi pen given.  See primary in 1 week.     Relevant Medications    triamcinolone acetonide (KENALOG-40) injection 40 mg (Completed)    predniSONE (DELTASONE) 10 MG tablet    loratadine (CLARITIN) 10 MG tablet    EPINEPHrine 0.3 mg/0.3 mL IJ SOAJ injection       Meds ordered this encounter  Medications  . diphenhydrAMINE (BENADRYL) 25 MG tablet    Sig: Take 25 mg by mouth every 6 (six) hours as needed. 2 tabs every 6 hrs  . triamcinolone acetonide (KENALOG-40) injection 40 mg    Sig:   . predniSONE (DELTASONE) 10 MG tablet    Sig: Take 1 tablet (10 mg total) by mouth daily with breakfast. 6,5,4,3,2,1 taper.    Dispense:  21 tablet    Refill:  0    Order Specific Question:  Supervising Provider    Answer:  Janeann Forehand [phone]  . loratadine (CLARITIN) 10 MG tablet    Sig: Take 1 tablet (10 mg total) by mouth daily.    Dispense:  30 tablet    Refill:  11    Order Specific Question:  Supervising Provider    Answer:  Janeann Forehand [phone]  . EPINEPHrine 0.3 mg/0.3 mL IJ SOAJ injection    Sig: Inject 0.3 mLs (0.3 mg total) into the muscle once.    Dispense:  1 Device    Refill:  11    Order Specific Question:  Supervising Provider    Answer:  Janeann Forehand [phone]      Follow up plan: Return in about 1 year (around 02/22/2016) for Allergic Reaction.

## 2015-02-23 ENCOUNTER — Encounter: Payer: Self-pay | Admitting: Family Medicine

## 2015-02-23 ENCOUNTER — Ambulatory Visit (INDEPENDENT_AMBULATORY_CARE_PROVIDER_SITE_OTHER): Payer: BLUE CROSS/BLUE SHIELD | Admitting: Family Medicine

## 2015-02-23 VITALS — BP 109/75 | HR 98 | Temp 97.7°F | Resp 16 | Ht 64.0 in | Wt 145.0 lb

## 2015-02-23 DIAGNOSIS — T7840XD Allergy, unspecified, subsequent encounter: Secondary | ICD-10-CM | POA: Diagnosis not present

## 2015-02-23 MED ORDER — METHYLPREDNISOLONE ACETATE 80 MG/ML IJ SUSP
80.0000 mg | Freq: Once | INTRAMUSCULAR | Status: AC
  Administered 2015-02-23: 80 mg via INTRAMUSCULAR

## 2015-02-23 MED ORDER — HYDROXYZINE HCL 25 MG PO TABS
25.0000 mg | ORAL_TABLET | Freq: Three times a day (TID) | ORAL | Status: DC | PRN
Start: 2015-02-23 — End: 2015-08-27

## 2015-02-23 NOTE — Patient Instructions (Signed)
Continue to take prednisone at home. Try atarax for itching. If you develop swelling around the mouth, lips, tongue use epi-pen and go to the ER.  If you vision changes or hives spread to eye, go to the ER.  Any progression of symptoms or concerning symptoms, go to the ER.   Follow up with Elnita Maxwellheryl next week.

## 2015-02-23 NOTE — Progress Notes (Signed)
Subjective:    Patient ID: Teresa Gardner, female    DOB: Jul 31, 1965, 49 y.o.   MRN: [id number]  HPI: Teresa Gardner is a 49 y.o. female presenting on 02/23/2015 for Rash   HPI  Pt presents for recheck of rash and allergic reaction that began on 11/30. It is getting worse. Swelling in the face is worse- around eyes. No trouble breathing. No lip or tongue. No change in vision. She is also reporting swelling in the hands.  Unknown allergen. Suspect ciprofloxacin.   Past Medical History  Diagnosis Date  . Anxiety   . Depression   . Insomnia   . Cervicalgia    Social History   Social History  . Marital Status: Divorced    Spouse Name: Teresa Gardner  . Number of Children: Teresa Gardner  . Years of Education: Teresa Gardner   Occupational History  . Not on file.   Social History Main Topics  . Smoking status: Never Smoker   . Smokeless tobacco: Never Used  . Alcohol Use: 0.0 oz/week    0 Standard drinks or equivalent per week     Comment: on occasion  . Drug Use: No  . Sexual Activity: Yes   Other Topics Concern  . Not on file   Social History Narrative   Family History  Problem Relation Age of Onset  . Mental illness Daughter     bipolar  . Autism Son   . Leukemia Maternal Grandmother   . Alzheimer's disease Paternal Grandmother   . Liver disease Paternal Grandmother   . Breast cancer Neg Hx    Current Outpatient Prescriptions on File Prior to Visit  Medication Sig  . diphenhydrAMINE (BENADRYL) 25 MG tablet Take 25 mg by mouth every 6 (six) hours as needed. 2 tabs every 6 hrs  . EPINEPHrine 0.3 mg/0.3 mL IJ SOAJ injection Inject 0.3 mLs (0.3 mg total) into the muscle once.  Marland Kitchen FLUoxetine (PROZAC) 10 MG tablet Take 1 tablet (10 mg total) by mouth daily.  Marland Kitchen loratadine (CLARITIN) 10 MG tablet Take 1 tablet (10 mg total) by mouth daily.  . predniSONE (DELTASONE) 10 MG tablet Take 1 tablet (10 mg total) by mouth daily with breakfast. 6,5,4,3,2,1 taper.  . zolpidem (AMBIEN) 10 MG tablet TAKE 1  TABLET BY MOUTH AT BEDTIME AS NEEDED FOR SLEEP   No current facility-administered medications on file prior to visit.    Review of Systems  Constitutional: Negative for fever and chills.  HENT: Positive for facial swelling (bilateral eyes).   Eyes: Negative for redness, itching and visual disturbance.  Respiratory: Negative for chest tightness and shortness of breath.   Gastrointestinal: Negative for nausea and vomiting.  Skin: Positive for rash.   Per HPI unless specifically indicated above     Objective:    BP 109/75 mmHg  Pulse 98  Temp(Src) 97.7 F (36.5 C) (Oral)  Resp 16  Ht  (1.626 m)  Wt 145 lb (65.772 kg)  BMI 24.88 kg/m2  LMP 02/22/2015  Wt Readings from Last 3 Encounters:  02/23/15 145 lb (65.772 kg)  02/22/15 144 lb (65.318 kg)  01/09/15 144 lb 3.2 oz (65.409 kg)    Physical Exam  Constitutional: She is oriented to person, place, and time. She appears well-developed and well-nourished. No distress.  Eyes: Conjunctivae and EOM are normal. Pupils are equal, round, and reactive to light. Right eye exhibits no discharge. No scleral icterus.  Bilateral swelling below the eyes.   Musculoskeletal: Normal range of motion.  Mild  swelling of hands and forearm due to rash.   Neurological: She is alert and oriented to person, place, and time.  Skin: Rash noted. Rash is urticarial. She is not diaphoretic.  Diffuse urticarial rash on face, scalp, hangs, chest.   Results for orders placed or performed in visit on 11/01/14  CBC with Differential/Platelet  Result Value Ref Range   WBC 9.7 3.4 - 10.8 x10E3/uL   RBC 4.62 3.77 - 5.28 x10E6/uL   Hemoglobin 13.4 11.1 - 15.9 g/dL   Hematocrit 16.141.4 09.634.0 - 46.6 %   MCV 90 79 - 97 fL   MCH 29.0 26.6 - 33.0 pg   MCHC 32.4 31.5 - 35.7 g/dL   RDW 04.513.6 40.912.3 - 81.115.4 %   Platelets 255 150 - 379 x10E3/uL   Neutrophils 56 %   Lymphs 31 %   Monocytes 8 %   Eos 5 %   Basos 0 %   Neutrophils Absolute 5.3 1.4 - 7.0 x10E3/uL    Lymphocytes Absolute 3.0 0.7 - 3.1 x10E3/uL   Monocytes Absolute 0.8 0.1 - 0.9 x10E3/uL   EOS (ABSOLUTE) 0.5 (H) 0.0 - 0.4 x10E3/uL   Basophils Absolute 0.0 0.0 - 0.2 x10E3/uL   Immature Granulocytes 0 %   Immature Grans (Abs) 0.0 0.0 - 0.1 x10E3/uL  Comprehensive metabolic panel  Result Value Ref Range   Glucose 90 65 - 99 mg/dL   BUN 13 6 - 24 mg/dL   Creatinine, Ser 9.140.81 0.57 - 1.00 mg/dL   GFR calc non Af Amer 86 >59 mL/min/1.73   GFR calc Af Amer 99 >59 mL/min/1.73   BUN/Creatinine Ratio 16 9 - 23   Sodium 140 134 - 144 mmol/L   Potassium 4.0 3.5 - 5.2 mmol/L   Chloride 101 97 - 108 mmol/L   CO2 24 18 - 29 mmol/L   Calcium 9.1 8.7 - 10.2 mg/dL   Total Protein 6.8 6.0 - 8.5 g/dL   Albumin 4.4 3.5 - 5.5 g/dL   Globulin, Total 2.4 1.5 - 4.5 g/dL   Albumin/Globulin Ratio 1.8 1.1 - 2.5   Bilirubin Total 0.4 0.0 - 1.2 mg/dL   Alkaline Phosphatase 44 39 - 117 IU/L   AST 16 0 - 40 IU/L   ALT 14 0 - 32 IU/L  HIV antibody  Result Value Ref Range   HIV Screen 4th Generation wRfx Non Reactive Non Reactive  TSH  Result Value Ref Range   TSH 1.360 0.450 - 4.500 uIU/mL  Vit D  25 hydroxy (rtn osteoporosis monitoring)  Result Value Ref Range   Vit D, 25-Hydroxy 34.2 30.0 - 100.0 ng/mL  Lipid Panel w/o Chol/HDL Ratio  Result Value Ref Range   Cholesterol, Total 176 100 - 199 mg/dL   Triglycerides 782165 (H) 0 - 149 mg/dL   HDL 68 >95>39 mg/dL   VLDL Cholesterol Cal 33 5 - 40 mg/dL   LDL Calculated 75 0 - 99 mg/dL      Assessment & Plan:   Problem List Items Addressed This Visit    None    Visit Diagnoses    Allergic reaction, subsequent encounter    -  Primary    Steroid injection given again today. Continue prednisone taper. Atarax for itching. Continue claritin. Pt instructed to go to ER if facial swelling increases, visual changes, she has mouth or tongue swelling, or for other concerning symptoms. Follow-up with PCP in 1 week.    Relevant Medications    hydrOXYzine  (ATARAX/VISTARIL) 25 MG tablet  methylPREDNISolone acetate (DEPO-MEDROL) injection 80 mg (Completed)       Meds ordered this encounter  Medications  . traMADol (ULTRAM) 50 MG tablet    Sig: Take by mouth every 6 (six) hours as needed.  . cyclobenzaprine (FLEXERIL) 10 MG tablet    Sig: Take 10 mg by mouth daily.  . hydrOXYzine (ATARAX/VISTARIL) 25 MG tablet    Sig: Take 1 tablet (25 mg total) by mouth 3 (three) times daily as needed.    Dispense:  30 tablet    Refill:  0    Order Specific Question:  Supervising Provider    Answer:  Janeann Forehand [phone]  . methylPREDNISolone acetate (DEPO-MEDROL) injection 80 mg    Sig:       Follow up plan: Return in about 1 week (around 03/02/2015) for with PCP.

## 2015-03-05 ENCOUNTER — Ambulatory Visit: Payer: BLUE CROSS/BLUE SHIELD

## 2015-03-05 ENCOUNTER — Other Ambulatory Visit: Payer: Self-pay | Admitting: Unknown Physician Specialty

## 2015-03-27 ENCOUNTER — Ambulatory Visit: Payer: BLUE CROSS/BLUE SHIELD | Admitting: Neurology

## 2015-04-25 ENCOUNTER — Other Ambulatory Visit: Payer: Self-pay | Admitting: Unknown Physician Specialty

## 2015-04-26 NOTE — Telephone Encounter (Signed)
Pt needs to be seen for further refills

## 2015-04-29 ENCOUNTER — Other Ambulatory Visit: Payer: Self-pay | Admitting: Unknown Physician Specialty

## 2015-05-03 NOTE — Telephone Encounter (Signed)
Pt is scheduled for a CPE in August.  Does she need to be seen before then? Thanks.

## 2015-05-04 NOTE — Telephone Encounter (Signed)
I asked Elnita Maxwell about this. She said if the patient is taking her [place] everyday, then yes she does because she has to closely follow controlled substances.

## 2015-05-15 NOTE — Telephone Encounter (Signed)
Called pt, no answer, left voicemail to return call and schedule appt

## 2015-05-25 ENCOUNTER — Telehealth: Payer: Self-pay

## 2015-05-25 NOTE — Telephone Encounter (Signed)
No sorry.  I have not seen her in over 6 months

## 2015-05-25 NOTE — Telephone Encounter (Signed)
Patient called and stated that she has recently moved to [place] with her fiance. Patient wants to know if Elnita MaxwellCheryl will refill her Ambien until she can find a provider in [place]. Patient asked if we could fax the rx to [facility] in [place].

## 2015-05-25 NOTE — Telephone Encounter (Signed)
Called and let patient know what Elnita MaxwellCheryl had said. Patient stated she would be signing a release of information and sending it to us so we can release her records to her new provider.

## 2015-08-27 ENCOUNTER — Ambulatory Visit (INDEPENDENT_AMBULATORY_CARE_PROVIDER_SITE_OTHER): Payer: BLUE CROSS/BLUE SHIELD | Admitting: Unknown Physician Specialty

## 2015-08-27 ENCOUNTER — Encounter: Payer: Self-pay | Admitting: Unknown Physician Specialty

## 2015-08-27 VITALS — BP 118/78 | HR 73 | Temp 97.4°F | Ht 64.0 in | Wt 130.8 lb

## 2015-08-27 DIAGNOSIS — F329 Major depressive disorder, single episode, unspecified: Secondary | ICD-10-CM

## 2015-08-27 DIAGNOSIS — M542 Cervicalgia: Secondary | ICD-10-CM

## 2015-08-27 DIAGNOSIS — F32A Depression, unspecified: Secondary | ICD-10-CM

## 2015-08-27 DIAGNOSIS — G4700 Insomnia, unspecified: Secondary | ICD-10-CM

## 2015-08-27 MED ORDER — ZOLPIDEM TARTRATE 10 MG PO TABS: 10.0000 mg | ORAL_TABLET | Freq: Every evening | ORAL | Status: AC | PRN

## 2015-08-27 MED ORDER — CYCLOBENZAPRINE HCL 10 MG PO TABS: 10.0000 mg | ORAL_TABLET | Freq: Every day | ORAL | Status: AC

## 2015-08-27 MED ORDER — FLUOXETINE HCL 10 MG PO TABS: 10.0000 mg | ORAL_TABLET | Freq: Every day | ORAL | Status: DC

## 2015-08-27 NOTE — Assessment & Plan Note (Signed)
Continue Fluoxetine 
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
## 2015-08-27 NOTE — Assessment & Plan Note (Signed)
Refill muscle relaxants

## 2015-08-27 NOTE — Assessment & Plan Note (Signed)
Rx for Remus Lofflerambien and continue to work on mindfulness

## 2015-11-02 ENCOUNTER — Encounter: Payer: BLUE CROSS/BLUE SHIELD | Admitting: Unknown Physician Specialty

## 2015-11-06 ENCOUNTER — Encounter: Payer: BLUE CROSS/BLUE SHIELD | Admitting: Unknown Physician Specialty

## 2016-03-01 ENCOUNTER — Other Ambulatory Visit: Payer: Self-pay | Admitting: Unknown Physician Specialty

## 2016-09-12 IMAGING — MR MR CERVICAL SPINE W/O CM
5 series · 42 of 48 positions shown · non-contrast
Comparison: None.

CLINICAL DATA: Neck problem for 15 years. Dizziness when turning
the neck.

EXAM:
MRI CERVICAL SPINE WITHOUT CONTRAST
TECHNIQUE: Multiplanar, multisequence MR imaging of the cervical spine was
performed. No intravenous contrast was administered.

[Series 2: T2 · sagittal · 3.0mm · 0.56mm/px · 8 of 13 slices shown (1 of 2)]
[im 1/13]
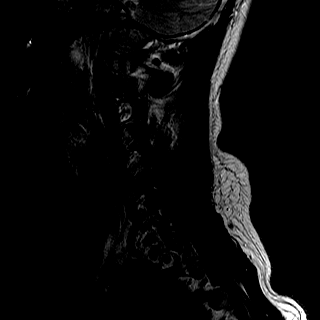
[im 2/13]
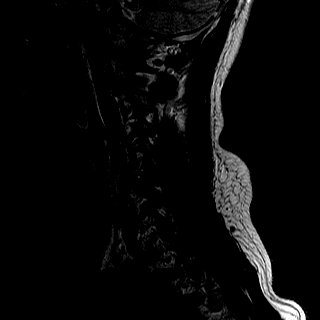
[im 4/13]
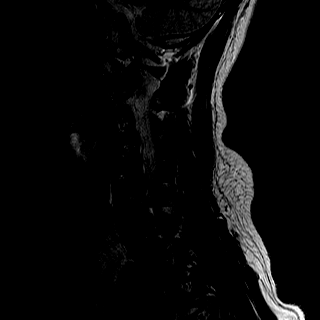
[im 6/13]
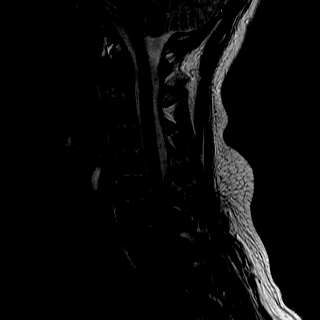
[im 7/13]
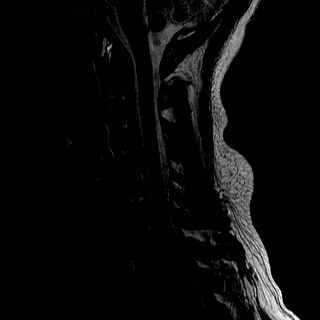
[im 9/13]
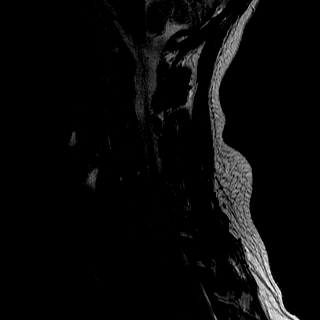
[im 11/13]
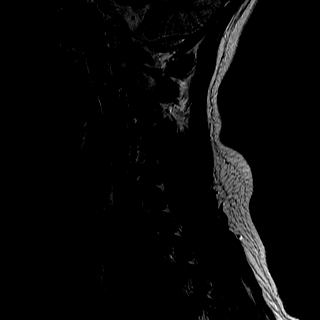
[im 13/13]
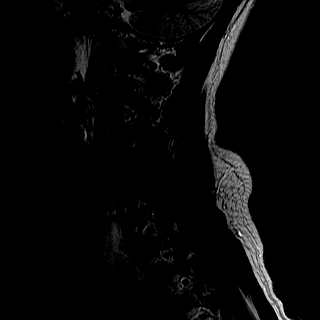

[Series 4: T1 · sagittal · 3.0mm · 0.70mm/px · 7 of 13 slices shown]
[im 1/13]
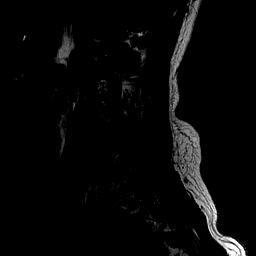
[im 3/13]
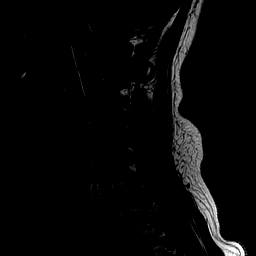
[im 5/13]
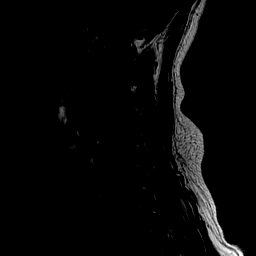
[im 7/13]
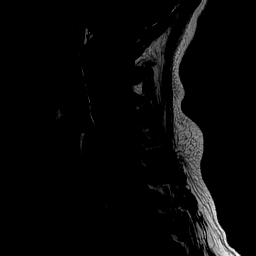
[im 9/13]
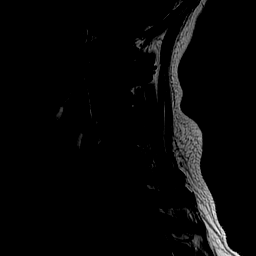
[im 11/13]
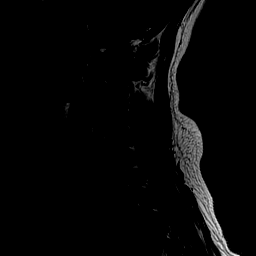
[im 13/13]
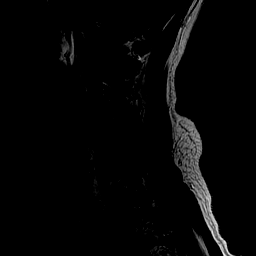

[Series 6: STIR · sagittal · 3.0mm · 0.70mm/px · 7 of 13 slices shown]
[im 1/13]
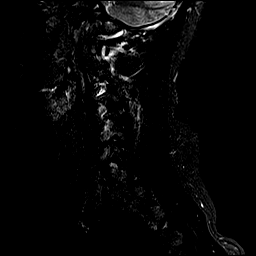
[im 3/13]
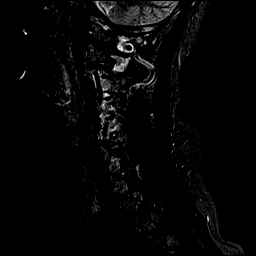
[im 5/13]
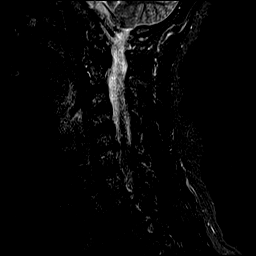
[im 7/13]
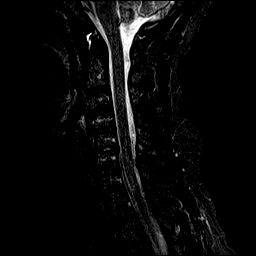
[im 9/13]
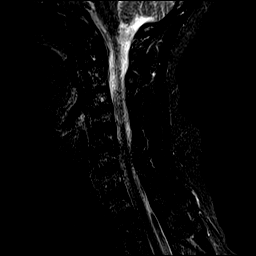
[im 11/13]
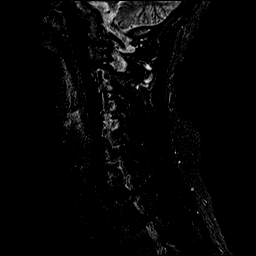
[im 13/13]
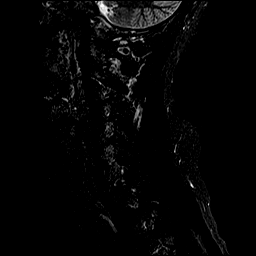

[Series 8: T2 · axial · 3.0mm · 0.70mm/px · z∈[-13,+79]mm · 12 of 25 slices shown (2 of 2)]
[im 1/25]
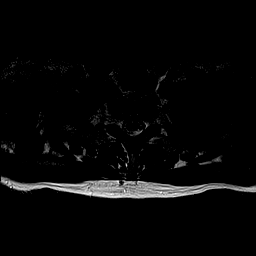
[im 3/25]
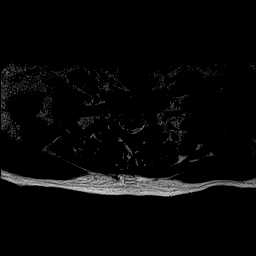
[im 5/25]
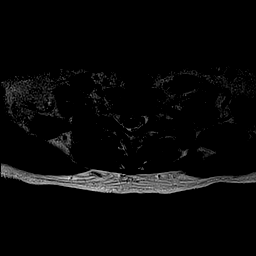
[im 7/25]
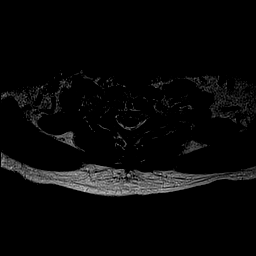
[im 9/25]
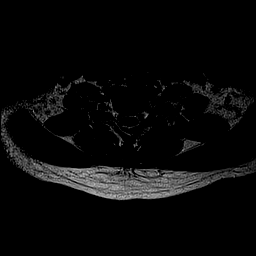
[im 11/25]
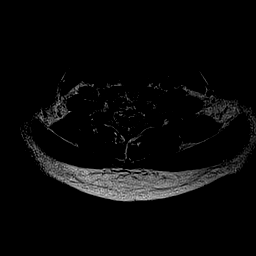
[im 13/25]
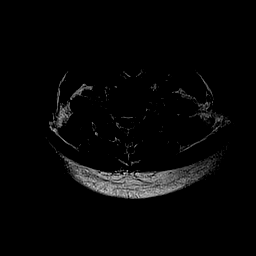
[im 15/25]
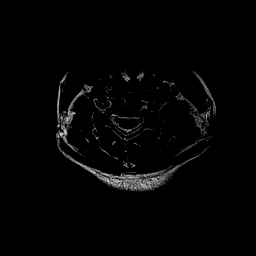
[im 17/25]
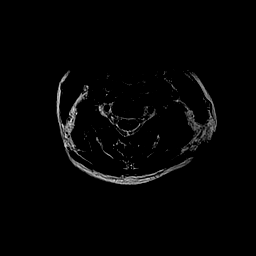
[im 19/25]
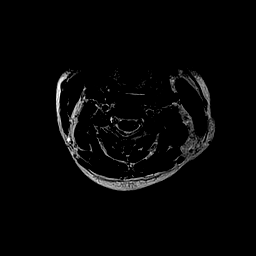
[im 21/25]
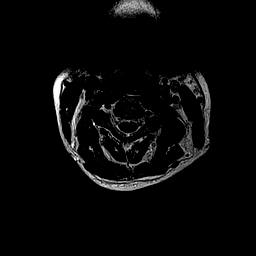
[im 25/25]
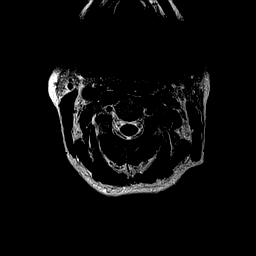

[Series 10: mpgr ax_fil_1 · axial · 3.0mm · 0.35mm/px · z∈[-13,+79]mm · 8 of 25 slices shown]
[im 1/25]
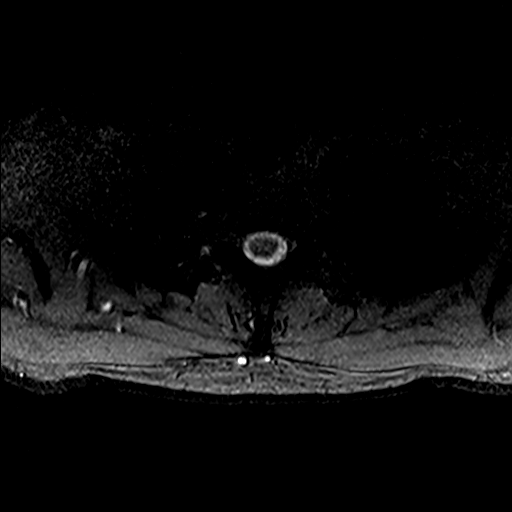
[im 5/25]
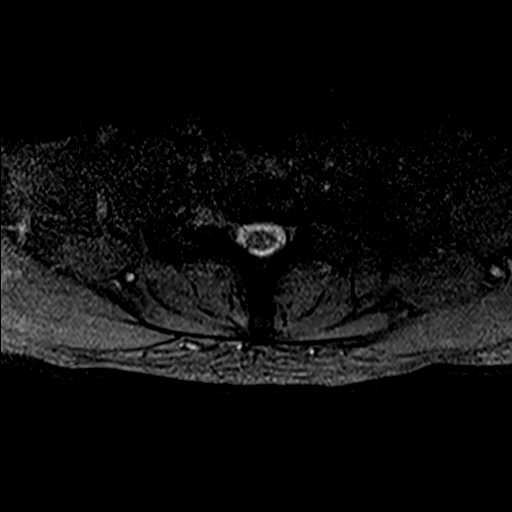
[im 9/25]
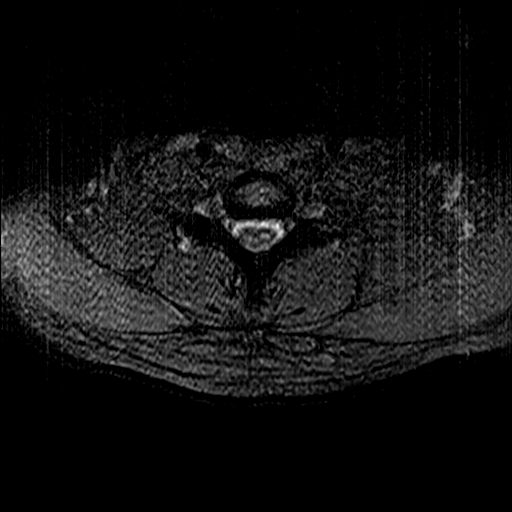
[im 11/25]
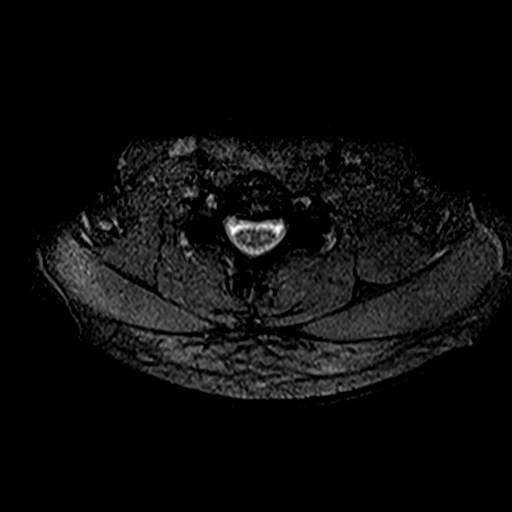
[im 15/25]
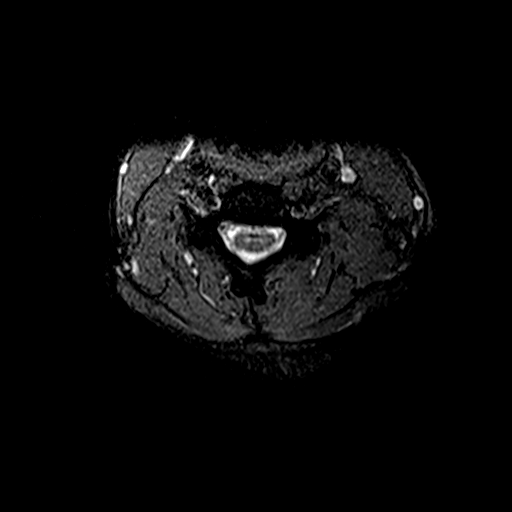
[im 17/25]
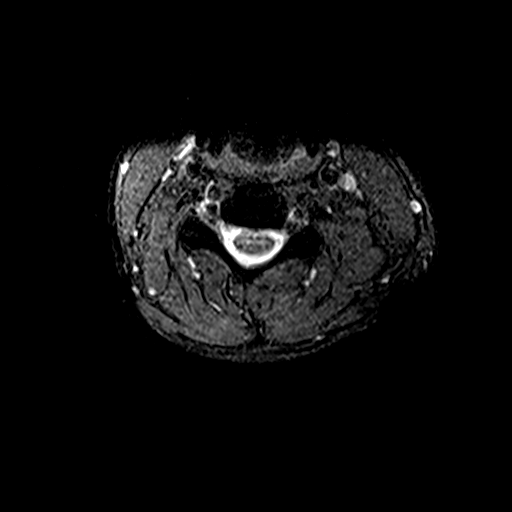
[im 21/25]
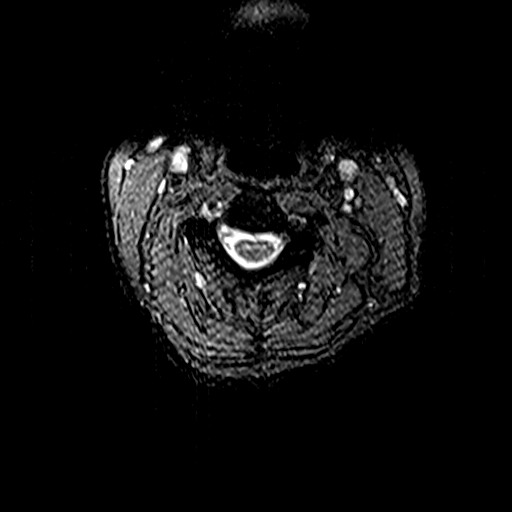
[im 25/25]
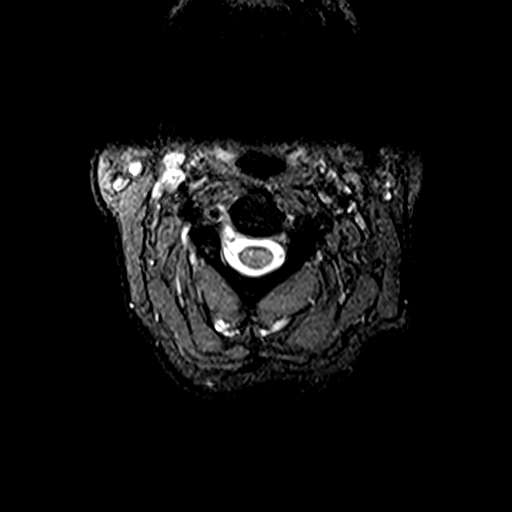

[42 of 48 positions shown; findings below may reference images not displayed]

FINDINGS: The cervical cord is normal in size and signal. Vertebral body
heights are maintained. The disc spaces are preserved. The cervical
spine is normal in lordotic alignment. There is 2 mm of
retrolisthesis of C5 on C6. Bone marrow signal is normal. Cerebellar
tonsils are normal in position.

C2-3: No significant disc bulge. No neural foraminal stenosis. No
central canal stenosis.

C3-4: No significant disc bulge. No neural foraminal stenosis. No
central canal stenosis.

C4-5: No significant disc bulge. No neural foraminal stenosis. No
central canal stenosis.

C5-6: Mild broad-based disc bulge. Bilateral uncovertebral
degenerative changes. Moderate right foraminal stenosis. No left
foraminal stenosis. No central canal stenosis.

C6-7: No significant disc bulge. No neural foraminal stenosis. No
central canal stenosis.

C7-T1: No significant disc bulge. No neural foraminal stenosis. No
central canal stenosis.
IMPRESSION: 1. At C5-6 there is a mild broad-based disc bulge. Mild bilateral
uncovertebral degenerative changes and moderate right foraminal
stenosis.

## 2018-01-21 ENCOUNTER — Ambulatory Visit (HOSPITAL_COMMUNITY)
Admission: EM | Admit: 2018-01-21 | Discharge: 2018-01-21 | Disposition: A | Discharge status: Other Acute Inpatient Hospital | Payer: Self-pay

## 2018-01-21 ENCOUNTER — Encounter (HOSPITAL_COMMUNITY): Payer: Self-pay | Admitting: Emergency Medicine

## 2018-01-21 ENCOUNTER — Ambulatory Visit (HOSPITAL_COMMUNITY)
Admission: EM | Admit: 2018-01-21 | Discharge: 2018-01-21 | Disposition: A | Discharge status: Home / Self Care | Payer: BLUE CROSS/BLUE SHIELD | Attending: Family Medicine | Admitting: Family Medicine

## 2018-01-21 DIAGNOSIS — Z79899 Other long term (current) drug therapy: Secondary | ICD-10-CM | POA: Diagnosis not present

## 2018-01-21 DIAGNOSIS — R350 Frequency of micturition: Secondary | ICD-10-CM | POA: Diagnosis present

## 2018-01-21 DIAGNOSIS — N390 Urinary tract infection, site not specified: Secondary | ICD-10-CM | POA: Insufficient documentation

## 2018-01-21 DIAGNOSIS — Z881 Allergy status to other antibiotic agents status: Secondary | ICD-10-CM | POA: Diagnosis not present

## 2018-01-21 LAB — POCT URINALYSIS DIP (DEVICE)
Bilirubin Urine: NEGATIVE
GLUCOSE, UA: NEGATIVE mg/dL
Ketones, ur: NEGATIVE mg/dL
NITRITE: NEGATIVE
PROTEIN: 30 mg/dL — AB
Specific Gravity, Urine: 1.01 (ref 1.005–1.030)
UROBILINOGEN UA: 0.2 mg/dL (ref 0.0–1.0)
pH: 6 (ref 5.0–8.0)

## 2018-01-21 MED ORDER — NITROFURANTOIN MONOHYD MACRO 100 MG PO CAPS: 100.0000 mg | ORAL_CAPSULE | Freq: Two times a day (BID) | ORAL | 0 refills | Status: AC

## 2018-01-21 NOTE — ED Provider Notes (Signed)
[facility]    CSN: [id number] Arrival date & time: 01/21/18  1533     History   Chief Complaint Chief Complaint  Patient presents with  . Appointment    345  . Urinary Tract Infection    HPI Teresa Gardner is a 52 y.o. female.   Lativia presents with complaints of burning urgency and frequency of urination. Started two days ago and is worsening. Has had UTI's in the past and this feels similar. Has been using Cystex which helps with burning. Has some pelvic pressure and low back pain. No known blood in urine. No fevers or chills. No nausea, vomiting or diarrhea. No vaginal symptoms. Hx of anxiety, depression, insomnia.     ROS per HPI.      Past Medical History:  Diagnosis Date  . Anxiety   . Cervicalgia   . Depression   . Insomnia     Patient Active Problem List   Diagnosis Date Noted  . Depression 01/09/2015  . Cervicalgia 11/01/2014  . Microscopic hematuria 11/01/2014  . Insomnia 11/01/2014    Past Surgical History:  Procedure Laterality Date  . NECK SURGERY  02/2015   pt states ACDF  . septo rhinoplasty    . TONSILLECTOMY    . TUBAL LIGATION      OB History   None      Home Medications    Prior to Admission medications   Medication Sig Start Date End Date Taking? Authorizing Provider  cyclobenzaprine (FLEXERIL) 10 MG tablet Take 1 tablet (10 mg total) by mouth daily. 08/27/15   Gabriel Cirri, NP  diphenhydrAMINE (BENADRYL) 25 MG tablet Take 25 mg by mouth every 6 (six) hours as needed. 2 tabs every 6 hrs    [provider]  EPINEPHrine 0.3 mg/0.3 mL IJ SOAJ injection Inject 0.3 mLs (0.3 mg total) into the muscle once. 02/22/15   Krebs, Laurel Dimmer, NP  FLUoxetine (PROZAC) 10 MG tablet TAKE 1 TABLET (10 MG TOTAL) BY MOUTH DAILY. Patient not taking: Reported on 01/21/2018 03/03/16   Gabriel Cirri, NP  nitrofurantoin, macrocrystal-monohydrate, (MACROBID) 100 MG capsule Take 1 capsule (100 mg total) by mouth 2 (two) times daily  for 5 days. 01/21/18 01/26/18  Georgetta Haber, NP  zolpidem (AMBIEN) 10 MG tablet Take 1 tablet (10 mg total) by mouth at bedtime as needed. for sleep 08/27/15   Gabriel Cirri, NP    Family History Family History  Problem Relation Age of Onset  . Mental illness Daughter        bipolar  . Autism Son   . Leukemia Maternal Grandmother   . Alzheimer's disease Paternal Grandmother   . Liver disease Paternal Grandmother   . Breast cancer Neg Hx     Social History Social History   Tobacco Use  . Smoking status: Never Smoker  . Smokeless tobacco: Never Used  Substance Use Topics  . Alcohol use: Yes    Alcohol/week: 0.0 standard drinks    Comment: on occasion  . Drug use: No     Allergies   Ciprofloxacin   Review of Systems Review of Systems   Physical Exam Triage Vital Signs ED Triage Vitals [01/21/18 1546]  Enc Vitals Group     BP 133/89     Pulse Rate 100     Resp 18     Temp 98.3 F (36.8 C)     Temp src      SpO2 100 %     Weight  Height      Head Circumference      Peak Flow      Pain Score 7     Pain Loc      Pain Edu?      Excl. in GC?    No data found.  Updated Vital Signs BP 133/89   Pulse 100   Temp 98.3 F (36.8 C)   Resp 18   SpO2 100%   Visual Acuity Right Eye Distance:   Left Eye Distance:   Bilateral Distance:    Right Eye Near:   Left Eye Near:    Bilateral Near:     Physical Exam  Constitutional: She is oriented to person, place, and time. She appears well-developed and well-nourished. No distress.  Cardiovascular: Normal rate, regular rhythm and normal heart sounds.  Pulmonary/Chest: Effort normal and breath sounds normal.  Abdominal: Soft. There is no tenderness. There is no rigidity, no rebound, no guarding and no CVA tenderness.  Genitourinary:  Genitourinary Comments: Denies sores, lesions, vaginal bleeding; no pelvic pain  Neurological: She is alert and oriented to person, place, and time.  Skin: Skin is warm and  dry.     UC Treatments / Results  Labs (all labs ordered are listed, but only abnormal results are displayed) Labs Reviewed  POCT URINALYSIS DIP (DEVICE) - Abnormal; Notable for the following components:      Result Value   Hgb urine dipstick LARGE (*)    Protein, ur 30 (*)    Leukocytes, UA LARGE (*)    All other components within normal limits  URINE CULTURE    EKG None  Radiology No results found.  Procedures Procedures (including critical care time)  Medications Ordered in UC Medications - No data to display  Initial Impression / Assessment and Plan / UC Course  I have reviewed the triage vital signs and the nursing notes.  Pertinent labs & imaging results that were available during my care of the patient were reviewed by me and considered in my medical decision making (see chart for details).     Urine with leuks and hgb, consistent with symptoms of UTI. Has had UTI in the past, culture obtained and pending. macrobid initiated. Supportive cares recommended. If symptoms worsen or do not improve in the next week to return to be seen or to follow up with PCP.  Patient verbalized understanding and agreeable to plan.   Final Clinical Impressions(s) / UC Diagnoses   Final diagnoses:  Lower urinary tract infectious disease     Discharge Instructions     Drink plenty of water to empty bladder regularly. Avoid alcohol and caffeine as these may irritate the bladder.   Complete course of antibiotics.  We would call if culture indicates any change to treatment.  If symptoms worsen or do not improve in the next week to return to be seen or to follow up with PCP.     ED Prescriptions    Medication Sig Dispense Auth. Provider   nitrofurantoin, macrocrystal-monohydrate, (MACROBID) 100 MG capsule Take 1 capsule (100 mg total) by mouth 2 (two) times daily for 5 days. 10 capsule Georgetta Haber, NP     Controlled Substance Prescriptions [place] Controlled Substance Registry  consulted? Not Applicable   Georgetta Haber, NP 01/21/18 1616

## 2018-01-21 NOTE — ED Triage Notes (Signed)
Pt c/o UTI symptoms, burning frequency urgency with urination.

## 2018-01-21 NOTE — ED Notes (Signed)
Bed: UC01 Expected date:  Expected time:  Means of arrival:  Comments: Appointment only

## 2018-01-21 NOTE — Discharge Instructions (Signed)
Drink plenty of water to empty bladder regularly. Avoid alcohol and caffeine as these may irritate the bladder.   Complete course of antibiotics.  We would call if culture indicates any change to treatment.  If symptoms worsen or do not improve in the next week to return to be seen or to follow up with PCP.

## 2018-01-24 LAB — URINE CULTURE: Culture: >=100000 — AB
